# Patient Record
Sex: Female | Born: 1990 | Race: Black or African American | Hispanic: No | Marital: Married | Smoking: Never smoker
Health system: Southern US, Community
[De-identification: ages and names within clinical notes are randomized; demographics above are authoritative.]

## PROBLEM LIST (undated history)

## (undated) DIAGNOSIS — J3489 Other specified disorders of nose and nasal sinuses: Secondary | ICD-10-CM

## (undated) HISTORY — PX: OTHER SURGICAL HISTORY: SHX169

## (undated) HISTORY — DX: Other specified disorders of nose and nasal sinuses: J34.89

---
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
## 2012-02-10 ENCOUNTER — Ambulatory Visit (INDEPENDENT_AMBULATORY_CARE_PROVIDER_SITE_OTHER): Payer: Commercial Managed Care - PPO | Admitting: Obstetrics and Gynecology

## 2012-02-10 ENCOUNTER — Encounter: Payer: Self-pay | Admitting: Obstetrics and Gynecology

## 2012-02-10 VITALS — BP 102/60 | HR 64 | Ht 67.0 in | Wt 153.0 lb

## 2012-02-10 DIAGNOSIS — B373 Candidiasis of vulva and vagina: Secondary | ICD-10-CM

## 2012-02-10 DIAGNOSIS — Z113 Encounter for screening for infections with a predominantly sexual mode of transmission: Secondary | ICD-10-CM

## 2012-02-10 DIAGNOSIS — N898 Other specified noninflammatory disorders of vagina: Secondary | ICD-10-CM

## 2012-02-10 MED ORDER — TERCONAZOLE 0.8 % VA CREA: 1.0000 | TOPICAL_CREAM | Freq: Every day | VAGINAL | Status: AC

## 2012-02-10 NOTE — Progress Notes (Signed)
Odor: yes Fever: no Pelvic Pain: no  Itching: no Dyspareunia: no Desires GC/CT: yes  Thin: no History of PID: no Desires HIV,RPR,HbsAG: no  Thick: yes History of STD: no Other: check for bv and want to tested for gc/ct

## 2012-02-10 NOTE — Progress Notes (Signed)
20 YO complains of vaginal discharge with odor.  Also want GC/CT testing.   O: Pelvic: EGBUS-wnl, vagina-white discharge, cervix-no lesions, uterus-normal size, adnexae-no tenderness/masses   Wet Prep: pH 4.5, whiff-negative, yeast   A: Yeast Vaginitis  P: GC/CT pending      Terzol 3 Vaginal  #1 1 applicatorful pv daily x 3 days 0 rf

## 2012-02-11 LAB — GC/CHLAMYDIA PROBE AMP, GENITAL
Chlamydia, DNA Probe: NEGATIVE
GC Probe Amp, Genital: NEGATIVE

## 2012-02-13 ENCOUNTER — Telehealth: Payer: Self-pay | Admitting: Obstetrics and Gynecology

## 2012-02-13 NOTE — Telephone Encounter (Signed)
-----   Message from Jerilynn Mages sent at 02/13/2012 3:39 PM -----  PT HAS QUESTION FOR EP.

## 2012-02-13 NOTE — Telephone Encounter (Deleted)
-----   Message from Russell E Dye sent at 02/13/2012 3:39 PM -----  PT HAS QUESTION FOR EP.  

## 2012-02-13 NOTE — Telephone Encounter (Signed)
Triage/epic 

## 2012-03-04 ENCOUNTER — Telehealth: Payer: Self-pay | Admitting: Obstetrics and Gynecology

## 2012-03-04 ENCOUNTER — Encounter: Payer: Self-pay | Admitting: Obstetrics and Gynecology

## 2012-03-04 ENCOUNTER — Ambulatory Visit (INDEPENDENT_AMBULATORY_CARE_PROVIDER_SITE_OTHER): Payer: Commercial Managed Care - PPO | Admitting: Obstetrics and Gynecology

## 2012-03-04 VITALS — BP 110/70 | Temp 98.6°F | Ht 67.0 in | Wt 158.0 lb

## 2012-03-04 DIAGNOSIS — N898 Other specified noninflammatory disorders of vagina: Secondary | ICD-10-CM

## 2012-03-04 DIAGNOSIS — N760 Acute vaginitis: Secondary | ICD-10-CM

## 2012-03-04 DIAGNOSIS — Z130 Encounter for screening for diseases of the blood and blood-forming organs and certain disorders involving the immune mechanism: Secondary | ICD-10-CM

## 2012-03-04 DIAGNOSIS — Z113 Encounter for screening for infections with a predominantly sexual mode of transmission: Secondary | ICD-10-CM

## 2012-03-04 DIAGNOSIS — B9689 Other specified bacterial agents as the cause of diseases classified elsewhere: Secondary | ICD-10-CM

## 2012-03-04 DIAGNOSIS — N390 Urinary tract infection, site not specified: Secondary | ICD-10-CM

## 2012-03-04 DIAGNOSIS — A499 Bacterial infection, unspecified: Secondary | ICD-10-CM

## 2012-03-04 DIAGNOSIS — Z13228 Encounter for screening for other metabolic disorders: Secondary | ICD-10-CM

## 2012-03-04 DIAGNOSIS — Z1321 Encounter for screening for nutritional disorder: Secondary | ICD-10-CM

## 2012-03-04 DIAGNOSIS — R809 Proteinuria, unspecified: Secondary | ICD-10-CM

## 2012-03-04 LAB — POCT WET PREP (WET MOUNT)
Whiff Test: NEGATIVE
pH: 4.5

## 2012-03-04 LAB — POCT URINALYSIS DIPSTICK
Bilirubin, UA: NEGATIVE
Blood, UA: 1
Glucose, UA: NEGATIVE
Ketones, UA: NEGATIVE
Nitrite, UA: NEGATIVE
Spec Grav, UA: 1.02
Urobilinogen, UA: NEGATIVE
pH, UA: 5

## 2012-03-04 MED ORDER — AMBULATORY NON FORMULARY MEDICATION: 1.0000 | Status: DC

## 2012-03-04 MED ORDER — METRONIDAZOLE 0.75 % VA GEL: 1.0000 | Freq: Two times a day (BID) | VAGINAL | Status: AC

## 2012-03-04 NOTE — Patient Instructions (Signed)
Avoid: - excess soap on genital area (consider using plain oatmeal soap) - use of powder or sprays in genital area - douching - wearing underwear to bed (except with menses) - using more than is directed detergent when washing clothes - tight fitting garments around genital area - excess sugar intake   [place] pharmacies that carry Boric Acid Capsules/Suppositories:  [facility] [address] (only)  [phone]  [facility]  [address] ([facility])  [phone]  [facility] ([place])  [address] [phone]  [facility]  [address] [phone]      

## 2012-03-04 NOTE — Progress Notes (Signed)
Color: WHITE Odor: yes Itching:no Thin:yes Thick:no Fever:no Dyspareunia:no Hx PID:no HX STD:no Pelvic Pain:no Desires Gc/CT:no Desires HIV,RPR,HbsAG:yesHIV,RPR,HSV2

## 2012-03-04 NOTE — Progress Notes (Signed)
20 YO reports protein in urine during a physical exam at work and would like her urine rechecked. Also complains of a vaginal discharge with odor and wants STD testing. Patient denies urinary tract symptoms but states that urine has an odor.   O: U/A= pH-5.0; SG-1.020; blood 1+; protein-trace; leukocytes-1+  Wet Prep:  pH-5.0;  whiff  +;   clue +  Pelvic: EGBUS-wnl; vagina-thin grey discharge; cervix-no lesions, uterus-non-tender   A:  Bacterial Vaginosis  H/O proteinura    P:  urine for culture           STD testing   Boric Acid Capsules 600 mg # 30 1 pv twice weekly             x 4 weeks then prn 11 refills   Metrogel Vaginal #1 tube 1 applicatorful pv qd x 5             days no refills   Vitamin D 25-H-pending   RTO as scheduled or prn  Cheryl Pereda, PA-C

## 2012-03-04 NOTE — Telephone Encounter (Signed)
TC TO PT REGARDING MSG, LM ON VM TO CALL BACK. 

## 2012-03-04 NOTE — Telephone Encounter (Signed)
EP pt/had appt today. 

## 2012-03-05 ENCOUNTER — Telehealth: Payer: Self-pay

## 2012-03-05 LAB — URINE CULTURE
Colony Count: NO GROWTH
Organism ID, Bacteria: NO GROWTH

## 2012-03-05 LAB — HIV ANTIBODY (ROUTINE TESTING W REFLEX): HIV: NONREACTIVE

## 2012-03-05 LAB — RPR

## 2012-03-05 LAB — HSV 2 ANTIBODY, IGG: HSV 2 Glycoprotein G Ab, IgG: <0.1 IV

## 2012-03-05 NOTE — Telephone Encounter (Signed)
Tc to pt regarding test results. Informed pt that her RPR,HIV,HSV2,  were all nl;vit d was abnormal. Went over vit d protocol with pt and will call in Rx vit d 50,000 units 1 cap 2x weekly for 8 wk's. Disp 28 with 0 refills to pt pharmacy.Pt voiced understanding

## 2012-06-22 ENCOUNTER — Ambulatory Visit (INDEPENDENT_AMBULATORY_CARE_PROVIDER_SITE_OTHER): Payer: 59 | Admitting: Obstetrics and Gynecology

## 2012-06-22 ENCOUNTER — Encounter: Payer: Self-pay | Admitting: Obstetrics and Gynecology

## 2012-06-22 VITALS — BP 90/66 | Resp 16 | Ht 67.0 in | Wt 157.0 lb

## 2012-06-22 DIAGNOSIS — Z113 Encounter for screening for infections with a predominantly sexual mode of transmission: Secondary | ICD-10-CM

## 2012-06-22 DIAGNOSIS — E559 Vitamin D deficiency, unspecified: Secondary | ICD-10-CM

## 2012-06-22 LAB — RPR

## 2012-06-22 LAB — HEPATITIS C ANTIBODY: HCV Ab: NEGATIVE

## 2012-06-22 LAB — HIV ANTIBODY (ROUTINE TESTING W REFLEX): HIV: NONREACTIVE

## 2012-06-22 MED ORDER — CHOLECALCIFEROL 125 MCG (5000 UT) PO CAPS: 5000.0000 [IU] | ORAL_CAPSULE | Freq: Every day | ORAL | Status: DC

## 2012-06-22 MED ORDER — METRONIDAZOLE 500 MG PO TABS: 500.0000 mg | ORAL_TABLET | Freq: Two times a day (BID) | ORAL | Status: AC

## 2012-06-22 MED ORDER — FLORAJEN3 PO CAPS: 1.0000 | ORAL_CAPSULE | Freq: Every day | ORAL | Status: DC

## 2012-06-22 NOTE — Patient Instructions (Signed)
Bacterial Vaginosis Bacterial vaginosis (BV) is a vaginal infection where the normal balance of bacteria in the vagina is disrupted. The normal balance is then replaced by an overgrowth of certain bacteria. There are several different kinds of bacteria that can cause BV. BV is the most common vaginal infection in women of childbearing age. CAUSES   The cause of BV is not fully understood. BV develops when there is an increase or imbalance of harmful bacteria.   Some activities or behaviors can upset the normal balance of bacteria in the vagina and put women at increased risk including:   Having a new sex partner or multiple sex partners.   Douching.   Using an intrauterine device (IUD) for contraception.   It is not clear what role sexual activity plays in the development of BV. However, women that have never had sexual intercourse are rarely infected with BV.  Women do not get BV from toilet seats, bedding, swimming pools or from touching objects around them.  SYMPTOMS   Grey vaginal discharge.   A fish-like odor with discharge, especially after sexual intercourse.   Itching or burning of the vagina and vulva.   Burning or pain with urination.   Some women have no signs or symptoms at all.  DIAGNOSIS  Your caregiver must examine the vagina for signs of BV. Your caregiver will perform lab tests and look at the sample of vaginal fluid through a microscope. They will look for bacteria and abnormal cells (clue cells), a pH test higher than 4.5, and a positive amine test all associated with BV.  RISKS AND COMPLICATIONS   Pelvic inflammatory disease (PID).   Infections following gynecology surgery.   Developing HIV.   Developing herpes virus.  TREATMENT  Sometimes BV will clear up without treatment. However, all women with symptoms of BV should be treated to avoid complications, especially if gynecology surgery is planned. Female partners generally do not need to be treated. However,  BV may spread between female sex partners so treatment is helpful in preventing a recurrence of BV.   BV may be treated with antibiotics. The antibiotics come in either pill or vaginal cream forms. Either can be used with nonpregnant or pregnant women, but the recommended dosages differ. These antibiotics are not harmful to the baby.   BV can recur after treatment. If this happens, a second round of antibiotics will often be prescribed.   Treatment is important for pregnant women. If not treated, BV can cause a premature delivery, especially for a pregnant woman who had a premature birth in the past. All pregnant women who have symptoms of BV should be checked and treated.   For chronic reoccurrence of BV, treatment with a type of prescribed gel vaginally twice a week is helpful.  HOME CARE INSTRUCTIONS   Finish all medication as directed by your caregiver.   Do not have sex until treatment is completed.   Tell your sexual partner that you have a vaginal infection. They should see their caregiver and be treated if they have problems, such as a mild rash or itching.   Practice safe sex. Use condoms. Only have 1 sex partner.  PREVENTION  Basic prevention steps can help reduce the risk of upsetting the natural balance of bacteria in the vagina and developing BV:  Do not have sexual intercourse (be abstinent).   Do not douche.   Use all of the medicine prescribed for treatment of BV, even if the signs and symptoms go away.     Tell your sex partner if you have BV. That way, they can be treated, if needed, to prevent reoccurrence.  SEEK MEDICAL CARE IF:   Your symptoms are not improving after 3 days of treatment.   You have increased discharge, pain, or fever.  MAKE SURE YOU:   Understand these instructions.   Will watch your condition.   Will get help right away if you are not doing well or get worse.  FOR MORE INFORMATION  Division of STD Prevention (DSTDP), Centers for Disease  Control and Prevention: www.cdc.gov/std American Social Health Association (ASHA): www.ashastd.org  Document Released: 09/15/2005 Document Revised: 09/04/2011 Document Reviewed: 03/08/2009 ExitCare Patient Information 2012 [employer]. 

## 2012-06-22 NOTE — Progress Notes (Signed)
Regular Periods: yes Mammogram: no  Monthly Breast Ex.: yes Exercise: yes  Tetanus < 10 years: yes Seatbelts: yes  NI. Bladder Functn.: yes Abuse at home: no  Daily BM's: no but regular for her per pt Stressful Work: no  Healthy Diet: yes Sigmoid-Colonoscopy: never  Calcium: no Medical problems this year: Pt wants STD testing, needs Vit D rechecked, & c/o recurrent BV   LAST PAP: 09/2011 WNL  Contraception: Pt doesn't desire BC at this time .   Mammogram:  Never   PCP: Henreitta Leber  PMH: no changes  FMH: no changes   Last Bone Scan: Never

## 2012-06-23 LAB — GC/CHLAMYDIA PROBE AMP, GENITAL: GC Probe Amp, Genital: NEGATIVE

## 2012-06-24 ENCOUNTER — Telehealth: Payer: Self-pay | Admitting: Obstetrics and Gynecology

## 2012-06-24 NOTE — Telephone Encounter (Signed)
Spoke with pt rgd msg informed all lab wnl pt voice understanding

## 2012-06-24 NOTE — Telephone Encounter (Signed)
Lm on vm tcb rgd msg 

## 2012-08-11 NOTE — Progress Notes (Signed)
S: requests STD testing and f/u vit D O: EGBUS WNL Vagina and cervix appear normal  sm amt clear discharge, neg whiff Bimanual exam WNL, neg CMT, neg tenderness, no masses A: normal pelvic Trich and BV neg,  P: STD panel and vit D level Counseled re: safe sex, good hygiene rx probiotic  F/u PRN or January for annual   S.Elinora Weigand, CNM

## 2012-12-07 ENCOUNTER — Telehealth: Payer: Self-pay | Admitting: Obstetrics and Gynecology

## 2012-12-07 NOTE — Telephone Encounter (Signed)
Spoke with pt requesting test results from 05/2012. Informed pt RPR, HIV, Hep B, Hep C, Osom Trich, Osom BV, and Vit D all WNL. Pt agrees and voices understanding.

## 2012-12-10 ENCOUNTER — Telehealth: Payer: Self-pay | Admitting: Obstetrics and Gynecology

## 2012-12-10 NOTE — Telephone Encounter (Signed)
Pt advised no other call from office except by TG. Pt voiced understanding  Darien Ramus, CMA

## 2012-12-10 NOTE — Telephone Encounter (Signed)
LVM for pt to return call      Spoke with pt requesting test results from 05/2012. Informed pt RPR, HIV, Hep B, Hep C, Osom Trich, Osom BV, and Vit D all WNL. Pt agrees and voices understanding.   Note copied from TG on 12/07/12

## 2015-09-27 LAB — HM PAP SMEAR: HM PAP: NORMAL

## 2015-12-04 LAB — OB RESULTS CONSOLE ABO/RH: RH TYPE: POSITIVE

## 2015-12-04 LAB — OB RESULTS CONSOLE RUBELLA ANTIBODY, IGM: RUBELLA: IMMUNE

## 2015-12-04 LAB — OB RESULTS CONSOLE GC/CHLAMYDIA
Chlamydia: NEGATIVE
Gonorrhea: NEGATIVE

## 2015-12-04 LAB — OB RESULTS CONSOLE RPR: RPR: NONREACTIVE

## 2015-12-04 LAB — OB RESULTS CONSOLE ANTIBODY SCREEN: Antibody Screen: NEGATIVE

## 2015-12-04 LAB — OB RESULTS CONSOLE HEPATITIS B SURFACE ANTIGEN: Hepatitis B Surface Ag: NEGATIVE

## 2015-12-04 LAB — OB RESULTS CONSOLE HIV ANTIBODY (ROUTINE TESTING): HIV: NONREACTIVE

## 2016-01-01 ENCOUNTER — Encounter: Payer: Self-pay | Admitting: Family Medicine

## 2016-01-01 ENCOUNTER — Ambulatory Visit (INDEPENDENT_AMBULATORY_CARE_PROVIDER_SITE_OTHER): Payer: BLUE CROSS/BLUE SHIELD | Admitting: Family Medicine

## 2016-01-01 VITALS — BP 132/64 | HR 102 | Ht 67.0 in | Wt 187.0 lb

## 2016-01-01 DIAGNOSIS — Z0001 Encounter for general adult medical examination with abnormal findings: Secondary | ICD-10-CM

## 2016-01-01 DIAGNOSIS — J3089 Other allergic rhinitis: Secondary | ICD-10-CM

## 2016-01-01 DIAGNOSIS — J309 Allergic rhinitis, unspecified: Secondary | ICD-10-CM | POA: Insufficient documentation

## 2016-01-01 NOTE — Progress Notes (Addendum)
Tana ConchStephen Raygen Dahm, MD Phone: [phone]  Subjective:  Patient presents today to establish care. [facility]. Chief complaint-noted.   See problem oriented charting  The following were reviewed and entered/updated in epic: Past Medical History  Diagnosis Date  . Rhinorrhea     stuffy, runny year round   Patient Active Problem List   Diagnosis Date Noted  . Allergic rhinitis 01/01/2016   Past Surgical History  Procedure Laterality Date  . None      Family History  Problem Relation Age of Onset  . Heart disease Paternal Grandfather     unknown specific ailment  . Hypertension Paternal Grandmother   . Diabetes Maternal Grandmother   . Hypertension Mother   . Sarcoidosis Father     Medications- reviewed and updated Current Outpatient Prescriptions  Medication Sig Dispense Refill  . Prenatal Vit-Fe Fumarate-FA (PRENATAL VITAMIN PO) Take by mouth.     No current facility-administered medications for this visit.    Allergies-reviewed and updated No Known Allergies  Social History   Social History  . Marital Status: Married    Spouse Name: N/A  . Number of Children: N/A  . Years of Education: N/A   Social History Main Topics  . Smoking status: Never Smoker   . Smokeless tobacco: Not on file  . Alcohol Use: 0.0 - 0.6 oz/week    0-1 Standard drinks or equivalent per week     Comment: few times a month. as of 01/01/16 not drinking at all.   . Drug Use: No  . Sexual Activity: Yes   Other Topics Concern  . Not on file   Social History Narrative   Family: Married. Pregnant with first child as of 10/11/14.    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       Hobbies: four wheelers, enjoys travel    ROS--Full ROS was completed Review of Systems  Constitutional: Negative  for fever and chills.  HENT: Negative for hearing loss.   Eyes: Negative for blurred vision and double vision.  Respiratory: Negative for cough and shortness of breath.   Cardiovascular: Negative for chest pain and palpitations.  Gastrointestinal: Negative for heartburn and nausea.  Genitourinary: Negative for dysuria and urgency.  Musculoskeletal: Negative for myalgias and neck pain.  Skin: Negative for itching and rash.  Neurological: Negative for dizziness, tingling and headaches.  Endo/Heme/Allergies: Negative for polydipsia. Does not bruise/bleed easily.  Psychiatric/Behavioral: Negative for suicidal ideas and hallucinations.   Objective: BP 132/64 mmHg  Pulse 102  Ht 5\' 7"  (1.702 m)  Wt 187 lb (84.823 kg)  BMI 29.28 kg/m2 Gen: NAD, resting comfortably HEENT: Mucous membranes are moist. Oropharynx normal. TM normal. Eyes: sclera and lids normal, PERRLA Neck: no thyromegaly, no cervical lymphadenopathy CV: RRR no murmurs rubs or gallops Lungs: CTAB no crackles, wheeze, rhonchi Abdomen: soft/nontender/nondistended/normal bowel sounds. No rebound or guarding.  Ext: no edema Skin: warm, dry Neuro: 5/5 strength in upper and lower extremities, normal gait, normal reflexes  Assessment/Plan:  25 y.o. female presenting for annual physical.  Health Maintenance counseling: 1. Anticipatory guidance: Patient counseled regarding regular dental exams, eye exams, wearing seatbelts.  2. Risk factor reduction:  Advised patient of need for regular exercise and diet rich and fruits and vegetables to reduce risk of heart attack and stroke.  Walks 3x a week. Goal weight postpartum [id number] in long run.  3. Immunizations/screenings/ancillary studies Health Maintenance Due  Topic Date Due  . HIV Screening - done in pregnancy 09/11/2005  . TETANUS/TDAP - get between 27-36 weeks of pregnancy 09/11/2009  . PAP SMEAR- Sign release of information at the check out desk for records  09/12/2011   4.  Cervical cancer screening- getting records.  pap 09/27/15.  5. Breast cancer screening-  Start at age 33 (consider getting baseline at 69) 6. Colon cancer screening - start at age 50 with no family history  Allergic rhinitis S: Patient with long term issues with nasal congestion and runny nose. Is on claritin at present- ok per GYN.  Some improvement on this but still with rather severe congestion. Neti pot comes back out same nare at times.  A/P:Flonase is reasonable as topical- given poor control we will add this in OTC   Patient [redacted] weeks pregnant- will follow up prn with me after pregnancy. Healthy and doing well.  Return precautions advised for rhinitis   Meds ordered this encounter  Medications  . Prenatal Vit-Fe Fumarate-FA (PRENATAL VITAMIN PO)    Sig: Take by mouth.

## 2016-01-01 NOTE — Assessment & Plan Note (Signed)
S: Patient with long term issues with nasal congestion and runny nose. Is on claritin at present- ok per GYN.  Some improvement on this but still with rather severe congestion. Neti pot comes back out same nare at times.  A/P:Flonase is reasonable as topical- given poor control we will add this in OTC

## 2016-01-01 NOTE — Patient Instructions (Addendum)
Health Maintenance Due  Topic Date Due  . HIV Screening - done in pregnancy 09/11/2005  . TETANUS/TDAP - get between 27-36 weeks of pregnancy 09/11/2009  . PAP SMEAR- Sign release of information at the check out desk for records  09/12/2011   For the runny nose Claritin is fine in pregnancy You can also use flonase in addition to claritin if having issues  More than happy to see your husband. They may work him in for any 30 minute slot during the week (monday- Friday)

## 2016-01-04 ENCOUNTER — Encounter: Payer: Self-pay | Admitting: Family Medicine

## 2016-01-17 ENCOUNTER — Inpatient Hospital Stay (HOSPITAL_COMMUNITY): Admission: AD | Admit: 2016-01-17 | Payer: Self-pay | Source: Ambulatory Visit | Admitting: Obstetrics & Gynecology

## 2016-02-18 ENCOUNTER — Telehealth: Payer: Self-pay | Admitting: Family Medicine

## 2016-02-18 NOTE — Telephone Encounter (Signed)
Pt said  her ins will cover annual physical. md must use routine code. Pt said thank you

## 2016-02-18 NOTE — Addendum Note (Signed)
Addended by: Shelva MajesticHUNTER, Skyler Dusing O on: 02/18/2016 06:12 PM   Modules accepted: Level of Service

## 2016-02-18 NOTE — Telephone Encounter (Signed)
Please let her know I asked that we resubmit this as physical since we did complete CPE at time of visit. I updated documentation and and having it checked with coding if resubmission is possible.

## 2016-02-19 NOTE — Telephone Encounter (Signed)
Pt is returning misty call °

## 2016-02-19 NOTE — Telephone Encounter (Signed)
Pt advised of message. She wanted me to tell you thanks so much. FYI

## 2016-02-19 NOTE — Telephone Encounter (Signed)
Left message for pt to call back  °

## 2016-05-01 ENCOUNTER — Encounter: Payer: Self-pay | Admitting: Obstetrics and Gynecology

## 2016-06-19 LAB — OB RESULTS CONSOLE GBS: GBS: POSITIVE

## 2016-07-04 ENCOUNTER — Inpatient Hospital Stay (HOSPITAL_COMMUNITY)
Admission: AD | Admit: 2016-07-04 | Discharge: 2016-07-04 | Disposition: A | Discharge status: Home / Self Care | Payer: BLUE CROSS/BLUE SHIELD | Source: Ambulatory Visit | Attending: Obstetrics and Gynecology | Admitting: Obstetrics and Gynecology

## 2016-07-04 ENCOUNTER — Encounter (HOSPITAL_COMMUNITY): Payer: Self-pay

## 2016-07-04 DIAGNOSIS — Z3A38 38 weeks gestation of pregnancy: Secondary | ICD-10-CM | POA: Insufficient documentation

## 2016-07-04 DIAGNOSIS — O219 Vomiting of pregnancy, unspecified: Secondary | ICD-10-CM

## 2016-07-04 DIAGNOSIS — Z3689 Encounter for other specified antenatal screening: Secondary | ICD-10-CM

## 2016-07-04 DIAGNOSIS — O368130 Decreased fetal movements, third trimester, not applicable or unspecified: Secondary | ICD-10-CM | POA: Insufficient documentation

## 2016-07-04 DIAGNOSIS — O212 Late vomiting of pregnancy: Secondary | ICD-10-CM | POA: Diagnosis not present

## 2016-07-04 DIAGNOSIS — Z3493 Encounter for supervision of normal pregnancy, unspecified, third trimester: Secondary | ICD-10-CM

## 2016-07-04 LAB — URINE MICROSCOPIC-ADD ON: RBC / HPF: NONE SEEN RBC/hpf (ref 0–5)

## 2016-07-04 LAB — URINALYSIS, ROUTINE W REFLEX MICROSCOPIC
Bilirubin Urine: NEGATIVE
Glucose, UA: 100 mg/dL — AB
Hgb urine dipstick: NEGATIVE
Ketones, ur: NEGATIVE mg/dL
Nitrite: NEGATIVE
PROTEIN: NEGATIVE mg/dL
SPECIFIC GRAVITY, URINE: 1.015 (ref 1.005–1.030)
pH: 7 (ref 5.0–8.0)

## 2016-07-04 NOTE — MAU Provider Note (Signed)
History     CSN: [id number]  Arrival date and time: 07/04/16 1434   None     Chief Complaint  Patient presents with  . Decreased Fetal Movement  . Nausea   G2P0010 @38 .0 weeks here with decreased FM. She has felt some FM today but was decreased. She also reports vomiting immediately after she ate breakfast (sausage cheese biscuit).  She denies VB, LOF, and ctx. She ate a sandwich and fries for lunch and has tolerated that.    OB History    Gravida Para Term Preterm AB Living   2       1     SAB TAB Ectopic Multiple Live Births     1            Past Medical History:  Diagnosis Date  . Asthma   . Rhinorrhea    stuffy, runny year round    Past Surgical History:  Procedure Laterality Date  . none      Family History  Problem Relation Age of Onset  . Heart disease Paternal Grandfather     unknown specific ailment  . Hypertension Paternal Grandmother   . Diabetes Maternal Grandmother   . Hypertension Mother   . Sarcoidosis Father   . Hypertension Maternal Aunt     Social History  Substance Use Topics  . Smoking status: Never Smoker  . Smokeless tobacco: Never Used  . Alcohol use 0.0 - 0.6 oz/week     Comment: few times a month. as of 01/01/16 not drinking at all.     Allergies: No Known Allergies  Prescriptions Prior to Admission  Medication Sig Dispense Refill Last Dose  . ferrous sulfate 325 (65 FE) MG tablet Take 325 mg by mouth at bedtime.   07/03/2016 at Unknown time  . Prenatal Vit-Fe Fumarate-FA (PRENATAL MULTIVITAMIN) TABS tablet Take 1 tablet by mouth daily with lunch.   07/04/2016 at Unknown time    Review of Systems  Constitutional: Negative for chills and fever.  Gastrointestinal: Negative.  Negative for diarrhea.   Physical Exam   Blood pressure 119/66, pulse 112, temperature 98.6 F (37 C), temperature source Oral, resp. rate 16, height 5\' 7"  (1.702 m), weight 101.6 kg (224 lb).  Physical Exam  Constitutional: She is oriented to person,  place, and time. She appears well-developed and well-nourished.  HENT:  Head: Normocephalic and atraumatic.  Neck: Normal range of motion. Neck supple.  Cardiovascular: Normal rate.   Respiratory: Effort normal.  GI: Soft. She exhibits no distension. There is no tenderness.  gravid  Musculoskeletal: Normal range of motion.  Neurological: She is alert and oriented to person, place, and time.  Skin: Skin is warm and dry.  Psychiatric: She has a normal mood and affect.  EFM: 145 bpm, mod variability, + accels, no decels Toco: irregular, mild  Results for orders placed or performed during the hospital encounter of 07/04/16 (from the past 24 hour(s))  Urinalysis, Routine w reflex microscopic (not [facility])     Status: Abnormal   Collection Time: 07/04/16  2:55 PM  Result Value Ref Range   Color, Urine YELLOW YELLOW   APPearance CLEAR CLEAR   Specific Gravity, Urine 1.015 1.005 - 1.030   pH 7.0 5.0 - 8.0   Glucose, UA 100 (A) NEGATIVE mg/dL   Hgb urine dipstick NEGATIVE NEGATIVE   Bilirubin Urine NEGATIVE NEGATIVE   Ketones, ur NEGATIVE NEGATIVE mg/dL   Protein, ur NEGATIVE NEGATIVE mg/dL   Nitrite NEGATIVE NEGATIVE  Leukocytes, UA SMALL (A) NEGATIVE  Urine microscopic-add on     Status: Abnormal   Collection Time: 07/04/16  2:55 PM  Result Value Ref Range   Squamous Epithelial / LPF 0-5 (A) NONE SEEN   WBC, UA 0-5 0 - 5 WBC/hpf   RBC / HPF NONE SEEN 0 - 5 RBC/hpf   Bacteria, UA RARE (A) NONE SEEN    MAU Course  Procedures  MDM Pt reports +FM since arrival, audible FM, and reactive NST. No nausea or emesis. Discussed presentation, clinical findings, and plan with Dr. Su Hilt. Stable for discharge home.  Assessment and Plan   1. Third trimester pregnancy   2. Decreased fetal movements in third trimester, single or unspecified fetus   3. NST (non-stress test) reactive   4. Vomiting during pregnancy    Discharge home [facility] diet today Follow up in office next week as  scheduled    Medication List    TAKE these medications   ferrous sulfate 325 (65 FE) MG tablet Take 325 mg by mouth at bedtime.   prenatal multivitamin Tabs tablet Take 1 tablet by mouth daily with lunch.      Donette Larry, CNM 07/04/2016, 3:37 PM

## 2016-07-04 NOTE — MAU Note (Signed)
Patient presents with decreased fetal movement last time around 1:00, having nausea vomited 1 time.

## 2016-07-04 NOTE — Discharge Instructions (Signed)
Fetal Movement Counts  Patient Name: __________________________________________________ Patient Due Date: ____________________  Performing a fetal movement count is highly recommended in high-risk pregnancies, but it is good for every pregnant woman to do. Your health care provider may ask you to start counting fetal movements at 28 weeks of the pregnancy. Fetal movements often increase:  · After eating a full meal.  · After physical activity.  · After eating or drinking something sweet or cold.  · At rest.  Pay attention to when you feel the baby is most active. This will help you notice a pattern of your baby's sleep and wake cycles and what factors contribute to an increase in fetal movement. It is important to perform a fetal movement count at the same time each day when your baby is normally most active.   HOW TO COUNT FETAL MOVEMENTS  1. Find a quiet and comfortable area to sit or lie down on your left side. Lying on your left side provides the best blood and oxygen circulation to your baby.  2. Write down the day and time on a sheet of paper or in a journal.  3. Start counting kicks, flutters, swishes, rolls, or jabs in a 2-hour period. You should feel at least 10 movements within 2 hours.  4. If you do not feel 10 movements in 2 hours, wait 2-3 hours and count again. Look for a change in the pattern or not enough counts in 2 hours.  SEEK MEDICAL CARE IF:  · You feel less than 10 counts in 2 hours, tried twice.  · There is no movement in over an hour.  · The pattern is changing or taking longer each day to reach 10 counts in 2 hours.  · You feel the baby is not moving as he or she usually does.  Date: ____________ Movements: ____________ Start time: ____________ Finish time: ____________   Date: ____________ Movements: ____________ Start time: ____________ Finish time: ____________  Date: ____________ Movements: ____________ Start time: ____________ Finish time: ____________  Date: ____________ Movements:  ____________ Start time: ____________ Finish time: ____________  Date: ____________ Movements: ____________ Start time: ____________ Finish time: ____________  Date: ____________ Movements: ____________ Start time: ____________ Finish time: ____________  Date: ____________ Movements: ____________ Start time: ____________ Finish time: ____________  Date: ____________ Movements: ____________ Start time: ____________ Finish time: ____________   Date: ____________ Movements: ____________ Start time: ____________ Finish time: ____________  Date: ____________ Movements: ____________ Start time: ____________ Finish time: ____________  Date: ____________ Movements: ____________ Start time: ____________ Finish time: ____________  Date: ____________ Movements: ____________ Start time: ____________ Finish time: ____________  Date: ____________ Movements: ____________ Start time: ____________ Finish time: ____________  Date: ____________ Movements: ____________ Start time: ____________ Finish time: ____________  Date: ____________ Movements: ____________ Start time: ____________ Finish time: ____________   Date: ____________ Movements: ____________ Start time: ____________ Finish time: ____________  Date: ____________ Movements: ____________ Start time: ____________ Finish time: ____________  Date: ____________ Movements: ____________ Start time: ____________ Finish time: ____________  Date: ____________ Movements: ____________ Start time: ____________ Finish time: ____________  Date: ____________ Movements: ____________ Start time: ____________ Finish time: ____________  Date: ____________ Movements: ____________ Start time: ____________ Finish time: ____________  Date: ____________ Movements: ____________ Start time: ____________ Finish time: ____________   Date: ____________ Movements: ____________ Start time: ____________ Finish time: ____________  Date: ____________ Movements: ____________ Start time: ____________ Finish  time: ____________  Date: ____________ Movements: ____________ Start time: ____________ Finish time: ____________  Date: ____________ Movements: ____________ Start time:   ____________ Finish time: ____________  Date: ____________ Movements: ____________ Start time: ____________ Finish time: ____________  Date: ____________ Movements: ____________ Start time: ____________ Finish time: ____________  Date: ____________ Movements: ____________ Start time: ____________ Finish time: ____________   Date: ____________ Movements: ____________ Start time: ____________ Finish time: ____________  Date: ____________ Movements: ____________ Start time: ____________ Finish time: ____________  Date: ____________ Movements: ____________ Start time: ____________ Finish time: ____________  Date: ____________ Movements: ____________ Start time: ____________ Finish time: ____________  Date: ____________ Movements: ____________ Start time: ____________ Finish time: ____________  Date: ____________ Movements: ____________ Start time: ____________ Finish time: ____________  Date: ____________ Movements: ____________ Start time: ____________ Finish time: ____________   Date: ____________ Movements: ____________ Start time: ____________ Finish time: ____________  Date: ____________ Movements: ____________ Start time: ____________ Finish time: ____________  Date: ____________ Movements: ____________ Start time: ____________ Finish time: ____________  Date: ____________ Movements: ____________ Start time: ____________ Finish time: ____________  Date: ____________ Movements: ____________ Start time: ____________ Finish time: ____________  Date: ____________ Movements: ____________ Start time: ____________ Finish time: ____________  Date: ____________ Movements: ____________ Start time: ____________ Finish time: ____________   Date: ____________ Movements: ____________ Start time: ____________ Finish time: ____________  Date: ____________  Movements: ____________ Start time: ____________ Finish time: ____________  Date: ____________ Movements: ____________ Start time: ____________ Finish time: ____________  Date: ____________ Movements: ____________ Start time: ____________ Finish time: ____________  Date: ____________ Movements: ____________ Start time: ____________ Finish time: ____________  Date: ____________ Movements: ____________ Start time: ____________ Finish time: ____________  Date: ____________ Movements: ____________ Start time: ____________ Finish time: ____________   Date: ____________ Movements: ____________ Start time: ____________ Finish time: ____________  Date: ____________ Movements: ____________ Start time: ____________ Finish time: ____________  Date: ____________ Movements: ____________ Start time: ____________ Finish time: ____________  Date: ____________ Movements: ____________ Start time: ____________ Finish time: ____________  Date: ____________ Movements: ____________ Start time: ____________ Finish time: ____________  Date: ____________ Movements: ____________ Start time: ____________ Finish time: ____________     This information is not intended to replace advice given to you by your health care provider. Make sure you discuss any questions you have with your health care provider.     Document Released: 10/15/2006 Document Revised: 10/06/2014 Document Reviewed: 07/12/2012  [employer]vier Interactive Patient Education ©2016 [employer]

## 2016-07-08 ENCOUNTER — Other Ambulatory Visit: Payer: Self-pay | Admitting: Obstetrics & Gynecology

## 2016-07-09 ENCOUNTER — Observation Stay (HOSPITAL_COMMUNITY)
Admission: RE | Admit: 2016-07-09 | Discharge: 2016-07-09 | Disposition: A | Discharge status: Home / Self Care | Payer: BLUE CROSS/BLUE SHIELD | Source: Ambulatory Visit | Attending: Obstetrics & Gynecology | Admitting: Obstetrics & Gynecology

## 2016-07-09 ENCOUNTER — Other Ambulatory Visit: Payer: Self-pay | Admitting: Obstetrics and Gynecology

## 2016-07-09 DIAGNOSIS — Z3A38 38 weeks gestation of pregnancy: Secondary | ICD-10-CM | POA: Insufficient documentation

## 2016-07-09 DIAGNOSIS — O321XX0 Maternal care for breech presentation, not applicable or unspecified: Principal | ICD-10-CM | POA: Insufficient documentation

## 2016-07-09 LAB — CBC
HEMATOCRIT: 32.5 % — AB (ref 36.0–46.0)
Hemoglobin: 11.2 g/dL — ABNORMAL LOW (ref 12.0–15.0)
MCH: 31 pg (ref 26.0–34.0)
MCHC: 34.5 g/dL (ref 30.0–36.0)
MCV: 90 fL (ref 78.0–100.0)
Platelets: 178 10*3/uL (ref 150–400)
RBC: 3.61 MIL/uL — ABNORMAL LOW (ref 3.87–5.11)
RDW: 14 % (ref 11.5–15.5)
WBC: 9.5 10*3/uL (ref 4.0–10.5)

## 2016-07-09 LAB — ABO/RH: ABO/RH(D): O POS

## 2016-07-09 MED ORDER — TERBUTALINE SULFATE 1 MG/ML IJ SOLN
0.2500 mg | Freq: Once | INTRAMUSCULAR | Status: AC
  Administered 2016-07-09: 0.25 mg via SUBCUTANEOUS
  Filled 2016-07-09: qty 1

## 2016-07-09 NOTE — Procedures (Signed)
Cheryl Curtis, Cheryl Curtis Female, 24 y.o., 05-Jan-1991 MRN: [id number]  Pre-op diagnosis: 1.  Breech presentation. 2. 38 week 5 day EGA IUP  Post-op diagnosis: Same as above  Procedure: External cephalic version, unsuccessful  Surgeon:  Dr. Hoover BrownsEma Tatsuo Musial  Assistant:  Bernerd PhoNancy Prothero, CNM  Anesthesia:  None  Complications None  Findings:  Complete breech presenting fetus.   Indications:  25 y/o G2P0010 @ 6038 W 5 D EGA here for external cephalic version for breech presenting fetus    Procedure:   Informed consent was obtained from the patient to undergo the procedure. She was monitored on NST and found to have a category 1 tracing. An ultrasound done showed persistent complete breech presentation.  She received terbutaline subcutaneously.  External cephalic version was then attempted trying to move the baby in a forward turn unsuccessfully.  Attempt was made with backward turning, also unsuccessfully.  Fetal heart beat checks were performed every two minutes in between the attempts and remained in the normal range at each check.  After about 25 minutes of trial, patient expressed desire to terminate the procedure and wanted to be scheduled for a cesarean section.  Procedure was stopped and she was monitored by NST, found to have a category 1 tracing that was reactive.  She was discharged home in stable condition with plan of returning for a cesarean delivery at or after [redacted] weeks EGA.  We discussed risks, benefits and alternatives of cesarean delivery including risks of bleeding, infection, damage to organs, placenta complications such as placenta previa and accreta.  All her questions were answered.  Labor precautions as well as abruption and fetal kick counts were discussed.  Meanwhile she was going to try moxibustion to turn the baby.  She understood that moxibustion was not guaranteed to work and that a repeat ultrasound would be done pre-operatively before the cesarean section.   Dr. Sallye OberKulwa.

## 2016-07-09 NOTE — H&P (Signed)
Cheryl Curtis is a 25 y.o. female presenting for external cephalic version for breech presenting fetus. G2P0010 at 38 weeks 5 days EGA by LMP 10/12/15 and EDC 07/18/2016 (by LMP c/w US).     OB History    Gravida Para Term Preterm AB Living   2       1     SAB TAB Ectopic Multiple Live Births     1           Past Medical History:  Diagnosis Date  . Asthma   . Rhinorrhea    stuffy, runny year round   Past Surgical History:  Procedure Laterality Date  . none     Family History: family history includes Diabetes in her maternal grandmother; Heart disease in her paternal grandfather; Hypertension in her maternal aunt, mother, and paternal grandmother; Sarcoidosis in her father. Social History:  reports that she has never smoked. She has never used smokeless tobacco. She reports that she drinks alcohol. She reports that she does not use drugs.     Maternal Diabetes: No Genetic Screening: Normal Maternal Ultrasounds/Referrals: Abnormal:  Findings:   Other: Ultrasound 07/08/16: Complete breech presentation.  Posterior/fundal placenta.  AFI 14.  EFW 7lbs 14 oz.  Fetal Ultrasounds or other Referrals:  None Maternal Substance Abuse:  No Significant Maternal Medications:  None Significant Maternal Lab Results:  None Other Comments:  None  ROS  Constitutional: Denies fevers/chills Cardiovascular: Denies chest pain or palpitations Pulmonary: Denies coughing or wheezing Gastrointestinal: Denies nausea, vomiting or diarrhea Genitourinary: Denies pelvic pain, unusual vaginal bleeding, unusual vaginal discharge, dysuria, urgency or frequency.  Musculoskeletal: Denies muscle or joint aches and pain.  Neurology: Denies abnormal sensations such as tingling or numbness.   History   There were no vitals taken for this visit. Exam Physical Exam  Gen: NAD Abdomen:Soft, non tender, gravid.  150 BL, mod variability, reactive. TOCO: Irregular contractions Q 10 minutes (patient not feeling  them)  CBC    Component Value Date/Time   WBC 9.5 07/09/2016 0745   RBC 3.61 (L) 07/09/2016 0745   HGB 11.2 (L) 07/09/2016 0745   HCT 32.5 (L) 07/09/2016 0745   PLT 178 07/09/2016 0745   MCV 90.0 07/09/2016 0745   MCH 31.0 07/09/2016 0745   MCHC 34.5 07/09/2016 0745   RDW 14.0 07/09/2016 0745    Prenatal labs: ABO, Rh: --/--/O POS, O POS (10/11 0745) Antibody: NEG (10/11 0745) Rubella:  Immune RPR:  Non reactive  HBsAg:   Negative HIV:   Negative GBS:   Positive  Assessment/Plan:  [age] y/o P0 with breech presenting fetus here for external cephalic version  -Admit to Labor and delivery -NPO -IVfluids -Terbutaline Im X 1 -I discussed with patient and her partner risks, benefits and alternatives of external cephalic version including but not limited to risks of placenta abruption, rupture of membranes, abnormal fetal health tracing, fetal demise and cesarean delivery.  All of her questions were answered and she agreed to undergo the version.   Dr. Sallye OberKulwa.    [facility] CenterKULWA,Jarae Panas WAKURU 07/09/2016, 2:07 PM

## 2016-07-09 NOTE — Discharge Instructions (Signed)
Cesarean Delivery °Cesarean delivery is the birth of a baby through a cut (incision) in the abdomen and womb (uterus).  °LET YOUR HEALTH CARE PROVIDER KNOW ABOUT: °· All medicines you are taking, including vitamins, herbs, eye drops, creams, and over-the-counter medicines. °· Previous problems you or members of your family have had with the use of anesthetics. °· Any bleeding or blood clotting disorders you have. °· Family history of blood clots or bleeding disorders. °· Any history of deep vein thrombosis (DVT) or pulmonary embolism (PE). °· Previous surgeries you have had. °· Medical conditions you have. °· Any allergies you have. °· Complications involving the pregnancy. °RISKS AND COMPLICATIONS  °Generally, this is a safe procedure. However, as with any procedure, complications can occur. Possible complications include: °· Bleeding. °· Infection. °· Blood clots. °· Injury to surrounding organs. °· Problems with anesthesia. °· Injury to the baby. °BEFORE THE PROCEDURE  °· You may be given an antacid medicine to drink. This will prevent acid contents in your stomach from going into your lungs if you vomit during the surgery. °· You may be given an antibiotic medicine to prevent infection. °PROCEDURE  °· To prevent infection of your incision: °¨ Hair may be removed from your pubic area if it is near your incision. °¨ The skin of your pubic area and lower abdomen will be cleaned with a germ-killing solution (antiseptic). °· A tube (Foley catheter) will be placed in your bladder to drain your urine from your bladder into a bag. This keeps your bladder empty during surgery. °· An IV tube will be placed in your vein. °· You may be given medicine to numb the lower half of your body (regional anesthetic). If you were in labor, you may have already had an epidural in place which can be used in both labor and cesarean delivery. You may possibly be given medicine to make you sleep (general anesthetic) though this is not as  common. °· Your heart rate and your baby's heart rate will be monitored. °· An incision will be made in your abdomen that extends to your uterus. There are 2 basic kinds of incisions: °¨ The horizontal (transverse) incision. Horizontal incisions are from side to side and are used for most routine cesarean deliveries. °¨ The vertical incision. The vertical incision is from the top of the abdomen to the bottom and is less commonly used. It is often done for women who have a serious complication (extreme prematurity) or under emergency situations.  °¨ The horizontal and vertical incisions may both be used at the same time. However, this is very uncommon. °· An incision is then made in your uterus to deliver the baby. °· Your baby will be delivered. °· Your health care provider may place the baby on your chest. It is important to keep the baby warm. Your health care provider will dry off the baby, place the baby directly on your bare skin, and cover the baby with warm, dry blankets. °· Both incisions will be closed with absorbable stitches. °AFTER THE PROCEDURE  °· If you were awake during the surgery, you will see your baby right away. If you were asleep, you will see your baby as soon as you are awake. °· You may breastfeed your baby after surgery. °· You may be able to get up and walk the same day as the surgery. If you need to stay in bed for a period of time, you will receive help to turn, cough, and take deep breaths after   surgery. This helps prevent lung problems such as pneumonia.  Do not get out of bed alone the first time after surgery. You will need help getting out of bed until you are able to do this by yourself.  You may be able to shower the day after your cesarean delivery. After the bandage (dressing) is taken off the incision site, a nurse will assist you to shower if you would like help.  You may be directed to take actions to help prevent blood clots in your legs. These may  include:  Walking shortly after surgery, with someone assisting you. Moving around after surgery helps to improve blood flow.  Wearing compression stockings or using different types of devices.  Taking medicines to thin your blood (anticoagulants) if you are at high risk for DVT or PE.  Save any blood clots that you pass from your vagina. If you pass a clot while on the toilet, do not flush it. Call for the nurse. Tell the nurse if you think you are bleeding too much or passing too many clots.  You will be given medicine for pain and nausea as needed. Let your health care providers know if you are hurting. You may also be given an antibiotic to prevent an infection.  Your IV tube will be taken out when you are drinking a reasonable amount of fluids. The Foley catheter is taken out when you are up and walking.  If your blood type is Rh negative and your baby's blood type is Rh positive, you will be given a shot of anti-D immune globulin. This shot prevents you from having Rh problems with a future pregnancy. You should get the shot even if you had your tubes tied (tubal ligation).  If you are allowed to take the baby for a walk, place the baby in the bassinet and push it.   This information is not intended to replace advice given to you by your health care provider. Make sure you discuss any questions you have with your health care provider.   Document Released: 09/15/2005 Document Revised: 06/06/2015 Document Reviewed: 05/12/2012 Elsevier Interactive Patient Education 2016 [employer] Cesarean Delivery, Care After Refer to this sheet in the next few weeks. These instructions provide you with information on caring for yourself after your procedure. Your health care provider may also give you specific instructions. Your treatment has been planned according to current medical practices, but problems sometimes occur. Call your health care provider if you have any problems or questions after you  go home. HOME CARE INSTRUCTIONS  Only take over-the-counter or prescription medications as directed by your health care provider.  Do not drink alcohol, especially if you are breastfeeding or taking medication to relieve pain.  Do not chew or smoke tobacco.  Continue to use good perineal care. Good perineal care includes:  Wiping your perineum from front to back.  Keeping your perineum clean.  Check your surgical cut (incision) daily for increased redness, drainage, swelling, or separation of skin.  Clean your incision gently with soap and water every day, and then pat it dry. If your health care provider says it is okay, leave the incision uncovered. Use a bandage (dressing) if the incision is draining fluid or appears irritated. If the adhesive strips across the incision do not fall off within 7 days, carefully peel them off.  Hug a pillow when coughing or sneezing until your incision is healed. This helps to relieve pain.  Do not use tampons or douche  until your health care provider says it is okay.  Shower, wash your hair, and take tub baths as directed by your health care provider.  Wear a well-fitting bra that provides breast support.  Limit wearing support panties or control-top hose.  Drink enough fluids to keep your urine clear or pale yellow.  Eat high-fiber foods such as whole grain cereals and breads, brown rice, beans, and fresh fruits and vegetables every day. These foods may help prevent or relieve constipation.  Resume activities such as climbing stairs, driving, lifting, exercising, or traveling as directed by your health care provider.  Talk to your health care provider about resuming sexual activities. This is dependent upon your risk of infection, your rate of healing, and your comfort and desire to resume sexual activity.  Try to have someone help you with your household activities and your newborn for at least a few days after you leave the  hospital.  Rest as much as possible. Try to rest or take a nap when your newborn is sleeping.  Increase your activities gradually.  Keep all of your scheduled postpartum appointments. It is very important to keep your scheduled follow-up appointments. At these appointments, your health care provider will be checking to make sure that you are healing physically and emotionally. SEEK MEDICAL CARE IF:   You are passing large clots from your vagina. Save any clots to show your health care provider.  You have a foul smelling discharge from your vagina.  You have trouble urinating.  You are urinating frequently.  You have pain when you urinate.  You have a change in your bowel movements.  You have increasing redness, pain, or swelling near your incision.  You have pus draining from your incision.  Your incision is separating.  You have painful, hard, or reddened breasts.  You have a severe headache.  You have blurred vision or see spots.  You feel sad or depressed.  You have thoughts of hurting yourself or your newborn.  You have questions about your care, the care of your newborn, or medications.  You are dizzy or light-headed.  You have a rash.  You have pain, redness, or swelling at the site of the removed intravenous access (IV) tube.  You have nausea or vomiting.  You stopped breastfeeding and have not had a menstrual period within 12 weeks of stopping.  You are not breastfeeding and have not had a menstrual period within 12 weeks of delivery.  You have a fever. SEEK IMMEDIATE MEDICAL CARE IF:  You have persistent pain.  You have chest pain.  You have shortness of breath.  You faint.  You have leg pain.  You have stomach pain.  Your vaginal bleeding saturates 2 or more sanitary pads in 1 hour. MAKE SURE YOU:   Understand these instructions.  Will watch your condition.  Will get help right away if you are not doing well or get worse.   This  information is not intended to replace advice given to you by your health care provider. Make sure you discuss any questions you have with your health care provider.   Document Released: 06/07/2002 Document Revised: 10/06/2014 Document Reviewed: 05/12/2012 Elsevier Interactive Patient Education 2016 [employer] External Cephalic Version External cephalic version is turning a baby that is presenting his or her buttocks first (breech) or is lying sideways in the uterus (transverse) to a head-first position. This makes the labor and delivery faster, safer for the mother and baby, and lessens the  chance for a cesarean section. It should not be tried until the pregnancy is [redacted] weeks along or longer. BEFORE THE PROCEDURE   Do not take aspirin.  Do not eat for 4 hours before the procedure.  Tell your caregiver if you have a cold, fever, or an infection.  Tell your caregiver if you are having contractions.  Tell your caregiver if you are leaking or had a gush of fluid from your vagina.  Tell your caregiver if you have any vaginal bleeding or abnormal discharge.  If you are being admitted the same day, arrive at the hospital at least one hour before the procedure to sign any necessary documents and to get prepared for the procedure.  Tell your caregiver if you had any problems with anesthetics in the past.  Tell your caregiver if you are taking any medications that your caregiver does not know about. This includes over-the-counter and prescription drugs, herbs, eye drops and creams. PROCEDURE  First, an ultrasound is done to make sure the baby is breech or transverse.  A non-stress test or biophysical profile is done on the baby before the ECV. This is done to make sure it is safe for the baby to have the ECV. It may also be done after the procedure to make sure the baby is okay.  ECV is done in the delivery/surgical room with an anesthesiologist present. There should be a setup for an  emergency cesarean section with a full nursing and nursery staff available and ready.  The patient may be given a medication to relax the uterine muscles. An epidural may be given for any discomfort. It is helpful for the success of the ECV.  An electronic fetal monitor is placed on the uterus during the procedure to make sure the baby is okay.  If the mother is Rh-negative, Rho (D) immune globulin will be given to her to prevent Rh problems for future pregnancies.  The mother is followed closely for 2 to 3 hours after the procedure to make sure no problems develop. BENEFITS OF ECV  Easier and safer labor and delivery for the mother and baby.  Lower incidence of cesarean section.  Lower costs with a vaginal delivery. RISKS OF ECV  The placenta pulls away from the wall of the uterus before delivery (abruption of the placenta).  Rupture of the uterus, especially in patients with a previous cesarean section.  Fetal distress.  Early (premature) labor.  Premature rupture of the membranes.  The baby will return to the breech or transverse lie position.  Death of the fetus can happen but is very rare. ECV SHOULD BE STOPPED IF:  The fetal heart tones drop.  The mother is having a lot of pain.  You cannot turn the baby after several attempts. ECV SHOULD NOT BE DONE IF:  The non-stress test or biophysical profile is abnormal.  There is vaginal bleeding.  An abnormal shaped uterus is present.  There is heart disease or uncontrolled high blood pressure in the mother.  There are twins or more.  The placenta covers the opening of the cervix (placenta previa).  You had a previous cesarean section with a classical incision or major surgery of the uterus.  There is not enough amniotic fluid in the sac (oligohydramnios).  The baby is too small for the pregnancy or has not developed normally (anomaly).  Your membranes have ruptured. HOME CARE INSTRUCTIONS   Have someone take  you home after the procedure.  Rest at home  for several hours.  Have someone stay with you for a few hours after you get home.  After ECV, continue with your prenatal visits as directed.  Continue your regular diet, rest and activities.  Do not do any strenuous activities for a couple of days. SEEK IMMEDIATE MEDICAL CARE IF:   You develop vaginal bleeding.  You have fluid coming out of your vagina (bag of water may have broken).  You develop uterine contractions.  You do not feel the baby move or there is less movement of the baby.  You develop abdominal pain.  You develop an oral temperature of 102 F (38.9 C) or higher.   This information is not intended to replace advice given to you by your health care provider. Make sure you discuss any questions you have with your health care provider.   Document Released: 03/10/2007 Document Revised: 10/06/2014 Document Reviewed: 01/03/2009 Elsevier Interactive Patient Education 2016 [employer] Inc[employer] Fetal Movement Counts Patient Name: __________________________________________________ Patient Due Date: ____________________ Performing a fetal movement count is highly recommended in high-risk pregnancies, but it is good for every pregnant woman to do. Your health care provider may ask you to start counting fetal movements at 28 weeks of the pregnancy. Fetal movements often increase:  After eating a full meal.  After physical activity.  After eating or drinking something sweet or cold.  At rest. Pay attention to when you feel the baby is most active. This will help you notice a pattern of your baby's sleep and wake cycles and what factors contribute to an increase in fetal movement. It is important to perform a fetal movement count at the same time each day when your baby is normally most active.  HOW TO COUNT FETAL MOVEMENTS 1. Find a quiet and comfortable area to sit or lie down on your left side. Lying on your left side provides the  best blood and oxygen circulation to your baby. 2. Write down the day and time on a sheet of paper or in a journal. 3. Start counting kicks, flutters, swishes, rolls, or jabs in a 2-hour period. You should feel at least 10 movements within 2 hours. 4. If you do not feel 10 movements in 2 hours, wait 2-3 hours and count again. Look for a change in the pattern or not enough counts in 2 hours. SEEK MEDICAL CARE IF:  You feel less than 10 counts in 2 hours, tried twice.  There is no movement in over an hour.  The pattern is changing or taking longer each day to reach 10 counts in 2 hours.  You feel the baby is not moving as he or she usually does. Date: ____________ Movements: ____________ Start time: ____________ Doreatha MartinFinish time: ____________  Date: ____________ Movements: ____________ Start time: ____________ Doreatha MartinFinish time: ____________ Date: ____________ Movements: ____________ Start time: ____________ Doreatha MartinFinish time: ____________ Date: ____________ Movements: ____________ Start time: ____________ Doreatha MartinFinish time: ____________ Date: ____________ Movements: ____________ Start time: ____________ Doreatha MartinFinish time: ____________ Date: ____________ Movements: ____________ Start time: ____________ Doreatha MartinFinish time: ____________ Date: ____________ Movements: ____________ Start time: ____________ Doreatha MartinFinish time: ____________ Date: ____________ Movements: ____________ Start time: ____________ Doreatha MartinFinish time: ____________  Date: ____________ Movements: ____________ Start time: ____________ Doreatha MartinFinish time: ____________ Date: ____________ Movements: ____________ Start time: ____________ Doreatha MartinFinish time: ____________ Date: ____________ Movements: ____________ Start time: ____________ Doreatha MartinFinish time: ____________ Date: ____________ Movements: ____________ Start time: ____________ Doreatha MartinFinish time: ____________ Date: ____________ Movements: ____________ Start time: ____________ Doreatha MartinFinish time: ____________ Date: ____________ Movements: ____________  Start time: ____________ Doreatha MartinFinish time: ____________  Date: ____________ Movements: ____________ Start time: ____________ Doreatha Martin time: ____________  Date: ____________ Movements: ____________ Start time: ____________ Doreatha Martin time: ____________ Date: ____________ Movements: ____________ Start time: ____________ Doreatha Martin time: ____________ Date: ____________ Movements: ____________ Start time: ____________ Doreatha Martin time: ____________ Date: ____________ Movements: ____________ Start time: ____________ Doreatha Martin time: ____________ Date: ____________ Movements: ____________ Start time: ____________ Doreatha Martin time: ____________ Date: ____________ Movements: ____________ Start time: ____________ Doreatha Martin time: ____________ Date: ____________ Movements: ____________ Start time: ____________ Doreatha Martin time: ____________  Date: ____________ Movements: ____________ Start time: ____________ Doreatha Martin time: ____________ Date: ____________ Movements: ____________ Start time: ____________ Doreatha Martin time: ____________ Date: ____________ Movements: ____________ Start time: ____________ Doreatha Martin time: ____________ Date: ____________ Movements: ____________ Start time: ____________ Doreatha Martin time: ____________ Date: ____________ Movements: ____________ Start time: ____________ Doreatha Martin time: ____________ Date: ____________ Movements: ____________ Start time: ____________ Doreatha Martin time: ____________ Date: ____________ Movements: ____________ Start time: ____________ Doreatha Martin time: ____________  Date: ____________ Movements: ____________ Start time: ____________ Doreatha Martin time: ____________ Date: ____________ Movements: ____________ Start time: ____________ Doreatha Martin time: ____________ Date: ____________ Movements: ____________ Start time: ____________ Doreatha Martin time: ____________ Date: ____________ Movements: ____________ Start time: ____________ Doreatha Martin time: ____________ Date: ____________ Movements: ____________ Start time: ____________ Doreatha Martin time:  ____________ Date: ____________ Movements: ____________ Start time: ____________ Doreatha Martin time: ____________ Date: ____________ Movements: ____________ Start time: ____________ Doreatha Martin time: ____________  Date: ____________ Movements: ____________ Start time: ____________ Doreatha Martin time: ____________ Date: ____________ Movements: ____________ Start time: ____________ Doreatha Martin time: ____________ Date: ____________ Movements: ____________ Start time: ____________ Doreatha Martin time: ____________ Date: ____________ Movements: ____________ Start time: ____________ Doreatha Martin time: ____________ Date: ____________ Movements: ____________ Start time: ____________ Doreatha Martin time: ____________ Date: ____________ Movements: ____________ Start time: ____________ Doreatha Martin time: ____________ Date: ____________ Movements: ____________ Start time: ____________ Doreatha Martin time: ____________  Date: ____________ Movements: ____________ Start time: ____________ Doreatha Martin time: ____________ Date: ____________ Movements: ____________ Start time: ____________ Doreatha Martin time: ____________ Date: ____________ Movements: ____________ Start time: ____________ Doreatha Martin time: ____________ Date: ____________ Movements: ____________ Start time: ____________ Doreatha Martin time: ____________ Date: ____________ Movements: ____________ Start time: ____________ Doreatha Martin time: ____________ Date: ____________ Movements: ____________ Start time: ____________ Doreatha Martin time: ____________ Date: ____________ Movements: ____________ Start time: ____________ Doreatha Martin time: ____________  Date: ____________ Movements: ____________ Start time: ____________ Doreatha Martin time: ____________ Date: ____________ Movements: ____________ Start time: ____________ Doreatha Martin time: ____________ Date: ____________ Movements: ____________ Start time: ____________ Doreatha Martin time: ____________ Date: ____________ Movements: ____________ Start time: ____________ Doreatha Martin time: ____________ Date: ____________ Movements:  ____________ Start time: ____________ Doreatha Martin time: ____________ Date: ____________ Movements: ____________ Start time: ____________ Doreatha Martin time: ____________   This information is not intended to replace advice given to you by your health care provider. Make sure you discuss any questions you have with your health care provider.   Document Released: 10/15/2006 Document Revised: 10/06/2014 Document Reviewed: 07/12/2012 Elsevier Interactive Patient Education [employer]

## 2016-07-10 ENCOUNTER — Telehealth (HOSPITAL_COMMUNITY): Payer: Self-pay | Admitting: *Deleted

## 2016-07-10 ENCOUNTER — Encounter (HOSPITAL_COMMUNITY)
Admission: RE | Admit: 2016-07-10 | Discharge: 2016-07-10 | Disposition: A | Discharge status: Home / Self Care | Payer: BLUE CROSS/BLUE SHIELD | Source: Ambulatory Visit | Attending: Obstetrics and Gynecology | Admitting: Obstetrics and Gynecology

## 2016-07-10 LAB — CBC
HCT: 32 % — ABNORMAL LOW (ref 36.0–46.0)
Hemoglobin: 10.9 g/dL — ABNORMAL LOW (ref 12.0–15.0)
MCH: 30.4 pg (ref 26.0–34.0)
MCHC: 34.1 g/dL (ref 30.0–36.0)
MCV: 89.4 fL (ref 78.0–100.0)
PLATELETS: 180 10*3/uL (ref 150–400)
RBC: 3.58 MIL/uL — ABNORMAL LOW (ref 3.87–5.11)
RDW: 14 % (ref 11.5–15.5)
WBC: 8.3 10*3/uL (ref 4.0–10.5)

## 2016-07-10 LAB — TYPE AND SCREEN
ABO/RH(D): O POS
ABO/RH(D): O POS
ANTIBODY SCREEN: NEGATIVE
Antibody Screen: NEGATIVE

## 2016-07-10 NOTE — Patient Instructions (Signed)
[address] Cher[address] Dwan BoltM Zima  07/10/2016  Your procedure is scheduled on:  07/12/2016  [facility] at 0630 AM.     Call this number if you have problems the morning of surgery: [phone]   Remember:   Do not eat food:After Midnight.  Do not drink clear liquids: After Midnight.  Take these medicines the morning of surgery with A SIP OF WATER: none   Do not wear jewelry, make-up or nail polish.  Do not wear lotions, powders, or perfumes. Do not wear deodorant.  Do not shave 48 hours prior to surgery.  Do not bring valuables to the hospital.  [facility] is not   responsible for any belongings or valuables brought to the hospital.  Contacts, dentures or bridgework may not be worn into surgery.  Leave suitcase in the car. After surgery it may be brought to your room.  For patients admitted to the hospital, checkout time is 11:00 AM the day of              discharge.   Patients discharged the day of surgery will not be allowed to drive             home.  Name and phone number of your driver: [email or web address]  Special Instructions:   N/A   Please read over the following fact sheets that you were given:   Surgical Site Infection Prevention

## 2016-07-11 ENCOUNTER — Other Ambulatory Visit: Payer: Self-pay | Admitting: Obstetrics and Gynecology

## 2016-07-11 ENCOUNTER — Other Ambulatory Visit (HOSPITAL_COMMUNITY): Payer: Self-pay | Admitting: Certified Nurse Midwife

## 2016-07-11 LAB — RPR: RPR Ser Ql: NONREACTIVE

## 2016-07-11 NOTE — H&P (Signed)
Cheryl Curtis is a 25 y.o. female presenting for scheduled Primary Ceserean Section for Breech presentation  Pregnancy followed at [facility] since 11 weeks and remarkable for: GBS positive; Complete Breech presentation    OB History    Gravida Para Term Preterm AB Living   2       1     SAB TAB Ectopic Multiple Live Births     1           Past Medical History:  Diagnosis Date  . Asthma   . Rhinorrhea    stuffy, runny year round   Past Surgical History:  Procedure Laterality Date  . none      Family History:   family history includes Diabetes in her maternal grandmother; Heart disease in her paternal grandfather; Hypertension in her maternal aunt, mother, and paternal grandmother; Sarcoidosis in her father. Social History:    reports that she has never smoked. She has never used smokeless tobacco. She reports that she drinks alcohol. She reports that she does not use drugs.   Prenatal labs: ABO, Rh: --/--/O POS (10/12 1150) Antibody: NEG (10/12 1150) Rubella: !Error!  RPR: Non Reactive (10/12 1150)  HBsAg: Negative (03/07 0000)  HIV: Non-reactive (03/07 0000)  GBS: Positive (09/21 0000)    Prenatal Transfer Tool  Maternal Diabetes: No Genetic Screening: Normal Maternal Ultrasounds/Referrals: Normal Fetal Ultrasounds or other Referrals:  None Maternal Substance Abuse:  No Significant Maternal Medications:  Meds include: Other: iron Significant Maternal Lab Results: Lab values include: Group B Strep positive     General Appearance: Alert, appropriate appearance for age. No acute distress HEENT Exam: Grossly normal Chest/Respiratory Exam: Normal chest wall and respirations. Clear to auscultation Cardiovascular Exam: Regular rate and rhythm. S1, S2, no murmur Gastrointestinal Exam: soft, non-tender, Uterus gravid with size compatible with GA, Vertex presentation by Leopold's maneuvers Psychiatric Exam: Alert and oriented, appropriate  affect  ++++++++++++++++++++++++++++++++++++++++++++++++++++++++++++++++  Vaginal exam:   Fetal tracings:   ++++++++++++++++++++++++++++++++++++++++++++++++++++++++++++++++   Assessment/Plan:  IUP @  Void note   Cheryl Curtis CNM 07/11/2016, 5:05 PM

## 2016-07-11 NOTE — Discharge Summary (Signed)
Physician Discharge Summary  Patient ID: Haskel Khanmber M Carreno MRN: [id number] DOB/AGE: 1991/06/04 24 y.o.  Admit date: 07/09/2016 Discharge date: 07/09/2016  Admission Diagnoses:  1.  38 w 5 day EGA IUP 2.  Breech presentation  Discharge Diagnoses:  Active Problems:   Breech presentation   Discharged Condition: stable  Hospital Course: Patient presented for external cephalic versionw which was not successful. She desired a cesarean delivery and will be scheduled at [redacted] weeks EGA or later.  Meanwhile she would try moxibustion.   Consults: None  Significant Diagnostic Studies: Blood group: O pos.   Treatments: Terbutaline  Discharge Exam: There were no vitals taken for this visit. General appearance: alert, cooperative, no distress. NST: category 1 TOCO: Irregular contractions   Disposition: 01-Home or Self Care     Medication List    ASK your doctor about these medications   ferrous sulfate 325 (65 FE) MG tablet Take 325 mg by mouth at bedtime.   prenatal multivitamin Tabs tablet Take 1 tablet by mouth daily with lunch.        Follow up: Cesarean delivery at [redacted] weeks EGA.   SignedKonrad Felix: Divine Imber WAKURU, MD 07/11/2016, 11:44 AM

## 2016-07-12 ENCOUNTER — Encounter (HOSPITAL_COMMUNITY)
Admission: AD | Disposition: A | Discharge status: Home / Self Care | Payer: Self-pay | Source: Ambulatory Visit | Attending: Obstetrics and Gynecology

## 2016-07-12 ENCOUNTER — Encounter (HOSPITAL_COMMUNITY): Payer: Self-pay | Admitting: *Deleted

## 2016-07-12 ENCOUNTER — Inpatient Hospital Stay (HOSPITAL_COMMUNITY): Payer: BLUE CROSS/BLUE SHIELD | Admitting: Anesthesiology

## 2016-07-12 ENCOUNTER — Inpatient Hospital Stay (HOSPITAL_COMMUNITY)
Admission: AD | Admit: 2016-07-12 | Discharge: 2016-07-15 | DRG: 766 | Disposition: A | Discharge status: Home / Self Care | Payer: BLUE CROSS/BLUE SHIELD | Source: Ambulatory Visit | Attending: Obstetrics and Gynecology | Admitting: Obstetrics and Gynecology

## 2016-07-12 ENCOUNTER — Inpatient Hospital Stay (HOSPITAL_COMMUNITY)
Admission: RE | Admit: 2016-07-12 | Payer: BLUE CROSS/BLUE SHIELD | Source: Ambulatory Visit | Admitting: Obstetrics and Gynecology

## 2016-07-12 DIAGNOSIS — Z833 Family history of diabetes mellitus: Secondary | ICD-10-CM | POA: Diagnosis not present

## 2016-07-12 DIAGNOSIS — Z8249 Family history of ischemic heart disease and other diseases of the circulatory system: Secondary | ICD-10-CM | POA: Diagnosis not present

## 2016-07-12 DIAGNOSIS — O321XX0 Maternal care for breech presentation, not applicable or unspecified: Secondary | ICD-10-CM | POA: Diagnosis not present

## 2016-07-12 DIAGNOSIS — O329XX0 Maternal care for malpresentation of fetus, unspecified, not applicable or unspecified: Secondary | ICD-10-CM | POA: Diagnosis present

## 2016-07-12 DIAGNOSIS — O99824 Streptococcus B carrier state complicating childbirth: Secondary | ICD-10-CM | POA: Diagnosis present

## 2016-07-12 DIAGNOSIS — O321XX1 Maternal care for breech presentation, fetus 1: Secondary | ICD-10-CM | POA: Diagnosis not present

## 2016-07-12 DIAGNOSIS — Z3A39 39 weeks gestation of pregnancy: Secondary | ICD-10-CM | POA: Diagnosis not present

## 2016-07-12 SURGERY — Surgical Case
Anesthesia: Spinal | Wound class: Clean Contaminated

## 2016-07-12 MED ORDER — NALBUPHINE HCL 10 MG/ML IJ SOLN: 5.0000 mg | INTRAMUSCULAR | Status: DC | PRN

## 2016-07-12 MED ORDER — NALBUPHINE HCL 10 MG/ML IJ SOLN: 5.0000 mg | Freq: Once | INTRAMUSCULAR | Status: DC | PRN

## 2016-07-12 MED ORDER — DEXAMETHASONE SODIUM PHOSPHATE 10 MG/ML IJ SOLN
INTRAMUSCULAR | Status: DC | PRN
  Administered 2016-07-12: 10 mg via INTRAVENOUS

## 2016-07-12 MED ORDER — IBUPROFEN 600 MG PO TABS: 600.0000 mg | ORAL_TABLET | Freq: Four times a day (QID) | ORAL | Status: DC | PRN

## 2016-07-12 MED ORDER — DEXAMETHASONE SODIUM PHOSPHATE 10 MG/ML IJ SOLN
INTRAMUSCULAR | Status: AC
  Filled 2016-07-12: qty 1

## 2016-07-12 MED ORDER — OXYTOCIN 40 UNITS IN LACTATED RINGERS INFUSION - SIMPLE MED: 2.5000 [IU]/h | INTRAVENOUS | Status: AC

## 2016-07-12 MED ORDER — COCONUT OIL OIL
1.0000 | TOPICAL_OIL | Status: DC | PRN
Start: 2016-07-12 — End: 2016-07-15
  Administered 2016-07-13: 1 via TOPICAL
  Filled 2016-07-12: qty 120

## 2016-07-12 MED ORDER — CEFAZOLIN SODIUM-DEXTROSE 2-4 GM/100ML-% IV SOLN
2.0000 g | INTRAVENOUS | Status: AC
  Administered 2016-07-12 (×2): 2 g via INTRAVENOUS
  Filled 2016-07-12: qty 100

## 2016-07-12 MED ORDER — TETANUS-DIPHTH-ACELL PERTUSSIS 5-2.5-18.5 LF-MCG/0.5 IM SUSP: 0.5000 mL | Freq: Once | INTRAMUSCULAR | Status: DC

## 2016-07-12 MED ORDER — BUPIVACAINE HCL (PF) 0.25 % IJ SOLN
INTRAMUSCULAR | Status: DC | PRN
  Administered 2016-07-12: 20 mL

## 2016-07-12 MED ORDER — OXYTOCIN 40 UNITS IN LACTATED RINGERS INFUSION - SIMPLE MED
INTRAVENOUS | Status: DC | PRN
  Administered 2016-07-12: 40 [IU] via INTRAVENOUS

## 2016-07-12 MED ORDER — IBUPROFEN 600 MG PO TABS
600.0000 mg | ORAL_TABLET | Freq: Four times a day (QID) | ORAL | Status: DC
Start: 2016-07-12 — End: 2016-07-15
  Administered 2016-07-12 – 2016-07-15 (×10): 600 mg via ORAL
  Filled 2016-07-12 (×11): qty 1

## 2016-07-12 MED ORDER — ONDANSETRON HCL 4 MG/2ML IJ SOLN
INTRAMUSCULAR | Status: AC
  Filled 2016-07-12: qty 2

## 2016-07-12 MED ORDER — SCOPOLAMINE 1 MG/3DAYS TD PT72
1.0000 | MEDICATED_PATCH | Freq: Once | TRANSDERMAL | Status: DC
  Filled 2016-07-12: qty 1

## 2016-07-12 MED ORDER — MORPHINE SULFATE-NACL 0.5-0.9 MG/ML-% IV SOSY
PREFILLED_SYRINGE | INTRAVENOUS | Status: AC
  Filled 2016-07-12: qty 1

## 2016-07-12 MED ORDER — KETOROLAC TROMETHAMINE 30 MG/ML IJ SOLN
INTRAMUSCULAR | Status: AC
  Filled 2016-07-12: qty 1

## 2016-07-12 MED ORDER — BUPIVACAINE HCL (PF) 0.25 % IJ SOLN
INTRAMUSCULAR | Status: AC
  Filled 2016-07-12: qty 20

## 2016-07-12 MED ORDER — SCOPOLAMINE 1 MG/3DAYS TD PT72
MEDICATED_PATCH | TRANSDERMAL | Status: AC
  Filled 2016-07-12: qty 1

## 2016-07-12 MED ORDER — ACETAMINOPHEN 500 MG PO TABS
1000.0000 mg | ORAL_TABLET | Freq: Four times a day (QID) | ORAL | Status: AC
  Administered 2016-07-12 – 2016-07-13 (×3): 1000 mg via ORAL
  Filled 2016-07-12 (×3): qty 2

## 2016-07-12 MED ORDER — KETOROLAC TROMETHAMINE 30 MG/ML IJ SOLN: 30.0000 mg | Freq: Four times a day (QID) | INTRAMUSCULAR | Status: DC | PRN

## 2016-07-12 MED ORDER — WITCH HAZEL-GLYCERIN EX PADS: 1.0000 "application " | MEDICATED_PAD | CUTANEOUS | Status: DC | PRN

## 2016-07-12 MED ORDER — DIPHENHYDRAMINE HCL 25 MG PO CAPS
25.0000 mg | ORAL_CAPSULE | ORAL | Status: DC | PRN
  Filled 2016-07-12: qty 1

## 2016-07-12 MED ORDER — MEPERIDINE HCL 25 MG/ML IJ SOLN: 6.2500 mg | INTRAMUSCULAR | Status: DC | PRN

## 2016-07-12 MED ORDER — ACETAMINOPHEN 325 MG PO TABS
650.0000 mg | ORAL_TABLET | ORAL | Status: DC | PRN
  Administered 2016-07-13 – 2016-07-14 (×5): 650 mg via ORAL
  Filled 2016-07-12 (×5): qty 2

## 2016-07-12 MED ORDER — KETOROLAC TROMETHAMINE 30 MG/ML IJ SOLN: 30.0000 mg | Freq: Once | INTRAMUSCULAR | Status: DC

## 2016-07-12 MED ORDER — SENNOSIDES-DOCUSATE SODIUM 8.6-50 MG PO TABS
2.0000 | ORAL_TABLET | ORAL | Status: DC
  Administered 2016-07-12 – 2016-07-14 (×3): 2 via ORAL
  Filled 2016-07-12 (×3): qty 2

## 2016-07-12 MED ORDER — SIMETHICONE 80 MG PO CHEW: 80.0000 mg | CHEWABLE_TABLET | ORAL | Status: DC | PRN

## 2016-07-12 MED ORDER — OXYCODONE-ACETAMINOPHEN 5-325 MG PO TABS: 1.0000 | ORAL_TABLET | ORAL | Status: DC | PRN

## 2016-07-12 MED ORDER — KETOROLAC TROMETHAMINE 30 MG/ML IJ SOLN
30.0000 mg | Freq: Four times a day (QID) | INTRAMUSCULAR | Status: DC | PRN
  Administered 2016-07-12: 30 mg via INTRAMUSCULAR

## 2016-07-12 MED ORDER — OXYCODONE-ACETAMINOPHEN 5-325 MG PO TABS: 2.0000 | ORAL_TABLET | ORAL | Status: DC | PRN

## 2016-07-12 MED ORDER — MORPHINE SULFATE-NACL 0.5-0.9 MG/ML-% IV SOSY
PREFILLED_SYRINGE | INTRAVENOUS | Status: DC | PRN
  Administered 2016-07-12: .2 mg via INTRATHECAL

## 2016-07-12 MED ORDER — MENTHOL 3 MG MT LOZG: 1.0000 | LOZENGE | OROMUCOSAL | Status: DC | PRN

## 2016-07-12 MED ORDER — LACTATED RINGERS IV SOLN
INTRAVENOUS | Status: DC
  Administered 2016-07-12 (×3): via INTRAVENOUS

## 2016-07-12 MED ORDER — PHENYLEPHRINE 8 MG IN D5W 100 ML (0.08MG/ML) PREMIX OPTIME
INJECTION | INTRAVENOUS | Status: DC | PRN
  Administered 2016-07-12: 60 ug/min via INTRAVENOUS

## 2016-07-12 MED ORDER — SODIUM CHLORIDE 0.9 % IR SOLN
Status: DC | PRN
  Administered 2016-07-12: 1000 mL

## 2016-07-12 MED ORDER — ONDANSETRON HCL 4 MG/2ML IJ SOLN
INTRAMUSCULAR | Status: DC | PRN
  Administered 2016-07-12: 4 mg via INTRAVENOUS

## 2016-07-12 MED ORDER — NALOXONE HCL 2 MG/2ML IJ SOSY
1.0000 ug/kg/h | PREFILLED_SYRINGE | INTRAVENOUS | Status: DC | PRN
  Filled 2016-07-12: qty 2

## 2016-07-12 MED ORDER — DIPHENHYDRAMINE HCL 50 MG/ML IJ SOLN: 12.5000 mg | INTRAMUSCULAR | Status: DC | PRN

## 2016-07-12 MED ORDER — SOD CITRATE-CITRIC ACID 500-334 MG/5ML PO SOLN
30.0000 mL | ORAL | Status: AC
  Administered 2016-07-12: 30 mL via ORAL
  Filled 2016-07-12: qty 15

## 2016-07-12 MED ORDER — HYDROMORPHONE HCL 1 MG/ML IJ SOLN: 0.2500 mg | INTRAMUSCULAR | Status: DC | PRN

## 2016-07-12 MED ORDER — OXYTOCIN 10 UNIT/ML IJ SOLN
INTRAMUSCULAR | Status: AC
  Filled 2016-07-12: qty 4

## 2016-07-12 MED ORDER — PRENATAL MULTIVITAMIN CH
1.0000 | ORAL_TABLET | Freq: Every day | ORAL | Status: DC
  Administered 2016-07-13 – 2016-07-14 (×2): 1 via ORAL
  Filled 2016-07-12 (×2): qty 1

## 2016-07-12 MED ORDER — MEASLES, MUMPS & RUBELLA VAC ~~LOC~~ INJ
0.5000 mL | INJECTION | Freq: Once | SUBCUTANEOUS | Status: DC
  Filled 2016-07-12: qty 0.5

## 2016-07-12 MED ORDER — ONDANSETRON HCL 4 MG/2ML IJ SOLN: 4.0000 mg | Freq: Three times a day (TID) | INTRAMUSCULAR | Status: DC | PRN

## 2016-07-12 MED ORDER — NALOXONE HCL 0.4 MG/ML IJ SOLN: 0.4000 mg | INTRAMUSCULAR | Status: DC | PRN

## 2016-07-12 MED ORDER — SIMETHICONE 80 MG PO CHEW
80.0000 mg | CHEWABLE_TABLET | Freq: Three times a day (TID) | ORAL | Status: DC
  Administered 2016-07-13 – 2016-07-15 (×7): 80 mg via ORAL
  Filled 2016-07-12 (×7): qty 1

## 2016-07-12 MED ORDER — PROMETHAZINE HCL 25 MG/ML IJ SOLN: 6.2500 mg | INTRAMUSCULAR | Status: DC | PRN

## 2016-07-12 MED ORDER — FENTANYL CITRATE (PF) 100 MCG/2ML IJ SOLN
INTRAMUSCULAR | Status: DC | PRN
  Administered 2016-07-12: 12.5 ug via INTRATHECAL

## 2016-07-12 MED ORDER — METHYLERGONOVINE MALEATE 0.2 MG PO TABS: 0.2000 mg | ORAL_TABLET | ORAL | Status: DC | PRN

## 2016-07-12 MED ORDER — SIMETHICONE 80 MG PO CHEW
80.0000 mg | CHEWABLE_TABLET | ORAL | Status: DC
  Administered 2016-07-12 – 2016-07-14 (×3): 80 mg via ORAL
  Filled 2016-07-12 (×3): qty 1

## 2016-07-12 MED ORDER — FERROUS SULFATE 325 (65 FE) MG PO TABS
325.0000 mg | ORAL_TABLET | Freq: Two times a day (BID) | ORAL | Status: DC
  Administered 2016-07-13 – 2016-07-15 (×4): 325 mg via ORAL
  Filled 2016-07-12 (×5): qty 1

## 2016-07-12 MED ORDER — PHENYLEPHRINE 40 MCG/ML (10ML) SYRINGE FOR IV PUSH (FOR BLOOD PRESSURE SUPPORT)
PREFILLED_SYRINGE | INTRAVENOUS | Status: AC
  Filled 2016-07-12: qty 10

## 2016-07-12 MED ORDER — SODIUM CHLORIDE 0.9% FLUSH: 3.0000 mL | INTRAVENOUS | Status: DC | PRN

## 2016-07-12 MED ORDER — BUPIVACAINE IN DEXTROSE 0.75-8.25 % IT SOLN
INTRATHECAL | Status: DC | PRN
  Administered 2016-07-12: 1.6 mL via INTRATHECAL

## 2016-07-12 MED ORDER — METHYLERGONOVINE MALEATE 0.2 MG/ML IJ SOLN: 0.2000 mg | INTRAMUSCULAR | Status: DC | PRN

## 2016-07-12 MED ORDER — DIBUCAINE 1 % RE OINT: 1.0000 "application " | TOPICAL_OINTMENT | RECTAL | Status: DC | PRN

## 2016-07-12 MED ORDER — ZOLPIDEM TARTRATE 5 MG PO TABS: 5.0000 mg | ORAL_TABLET | Freq: Every evening | ORAL | Status: DC | PRN

## 2016-07-12 MED ORDER — LACTATED RINGERS IV SOLN
INTRAVENOUS | Status: DC | PRN
  Administered 2016-07-12: 14:00:00 via INTRAVENOUS
  Administered 2016-07-12: 1000 mL

## 2016-07-12 MED ORDER — DIPHENHYDRAMINE HCL 25 MG PO CAPS: 25.0000 mg | ORAL_CAPSULE | Freq: Four times a day (QID) | ORAL | Status: DC | PRN

## 2016-07-12 MED ORDER — FENTANYL CITRATE (PF) 100 MCG/2ML IJ SOLN
INTRAMUSCULAR | Status: AC
  Filled 2016-07-12: qty 2

## 2016-07-12 SURGICAL SUPPLY — 40 items
APL SKNCLS STERI-STRIP NONHPOA (GAUZE/BANDAGES/DRESSINGS) ×1
BENZOIN TINCTURE PRP APPL 2/3 (GAUZE/BANDAGES/DRESSINGS) ×3 IMPLANT
BOOTIES KNEE HIGH SLOAN (MISCELLANEOUS) ×6 IMPLANT
CHLORAPREP W/TINT 26ML (MISCELLANEOUS) ×3 IMPLANT
CLAMP CORD UMBIL (MISCELLANEOUS) IMPLANT
CLOSURE STERI STRIP 1/2 X4 (GAUZE/BANDAGES/DRESSINGS) ×2 IMPLANT
CLOTH BEACON ORANGE TIMEOUT ST (SAFETY) ×3 IMPLANT
DRAIN JACKSON PRT FLT 10 (DRAIN) IMPLANT
DRSG OPSITE POSTOP 4X10 (GAUZE/BANDAGES/DRESSINGS) ×3 IMPLANT
DRSG PAD ABDOMINAL 8X10 ST (GAUZE/BANDAGES/DRESSINGS) ×2 IMPLANT
ELECT REM PT RETURN 9FT ADLT (ELECTROSURGICAL) ×3
ELECTRODE REM PT RTRN 9FT ADLT (ELECTROSURGICAL) ×1 IMPLANT
EVACUATOR SILICONE 100CC (DRAIN) IMPLANT
EXTRACTOR VACUUM M CUP 4 TUBE (SUCTIONS) IMPLANT
EXTRACTOR VACUUM M CUP 4' TUBE (SUCTIONS)
GLOVE BIOGEL PI IND STRL 7.0 (GLOVE) ×2 IMPLANT
GLOVE BIOGEL PI INDICATOR 7.0 (GLOVE) ×4
GLOVE ECLIPSE 6.5 STRL STRAW (GLOVE) ×3 IMPLANT
GOWN STRL REUS W/TWL LRG LVL3 (GOWN DISPOSABLE) ×6 IMPLANT
KIT ABG SYR 3ML LUER SLIP (SYRINGE) IMPLANT
NDL HYPO 25X5/8 SAFETYGLIDE (NEEDLE) IMPLANT
NEEDLE HYPO 22GX1.5 SAFETY (NEEDLE) ×3 IMPLANT
NEEDLE HYPO 25X5/8 SAFETYGLIDE (NEEDLE) IMPLANT
NS IRRIG 1000ML POUR BTL (IV SOLUTION) ×6 IMPLANT
PACK C SECTION WH (CUSTOM PROCEDURE TRAY) ×3 IMPLANT
PAD OB MATERNITY 4.3X12.25 (PERSONAL CARE ITEMS) ×3 IMPLANT
PENCIL SMOKE EVAC W/HOLSTER (ELECTROSURGICAL) ×3 IMPLANT
RTRCTR C-SECT PINK 25CM LRG (MISCELLANEOUS) ×3 IMPLANT
SPONGE DRAIN TRACH 4X4 STRL 2S (GAUZE/BANDAGES/DRESSINGS) ×4 IMPLANT
STRIP CLOSURE SKIN 1/2X4 (GAUZE/BANDAGES/DRESSINGS) ×2 IMPLANT
SUT MNCRL AB 3-0 PS2 27 (SUTURE) ×3 IMPLANT
SUT SILK 2 0 FSL 18 (SUTURE) IMPLANT
SUT VIC AB 0 CTX 36 (SUTURE) ×9
SUT VIC AB 0 CTX36XBRD ANBCTRL (SUTURE) ×3 IMPLANT
SUT VIC AB 1 CT1 36 (SUTURE) ×6 IMPLANT
SUT VIC AB 2-0 CT1 27 (SUTURE)
SUT VIC AB 2-0 CT1 TAPERPNT 27 (SUTURE) IMPLANT
SYR 20CC LL (SYRINGE) ×3 IMPLANT
TOWEL OR 17X24 6PK STRL BLUE (TOWEL DISPOSABLE) ×3 IMPLANT
TRAY FOLEY CATH SILVER 14FR (SET/KITS/TRAYS/PACK) ×3 IMPLANT

## 2016-07-12 NOTE — Consult Note (Signed)
Neonatology Note:   Attendance at C-section:    I was asked by Dr. Rivard to attend this primary C/S at term for breech. The mother is a G2P0010, GBS pos with good prenatal care. No Labor or fever.  ROM 0 hours before delivery, fluid clear. Infant vigorous with good spontaneous cry and tone. + delayed cord clamping 30sec.  Needed only minimal bulb suctioning. Ap 8/9. Lungs clear to ausc in DR. To CN to care of Pediatrician.  Myan Suit C. Oneal Biglow, MD 

## 2016-07-12 NOTE — Anesthesia Preprocedure Evaluation (Addendum)
Anesthesia Evaluation  Patient identified by MRN, date of birth, ID band Patient awake    Reviewed: Allergy & Precautions, H&P , NPO status , Patient's Chart, lab work & pertinent test results  Airway Mallampati: II  TM Distance: >3 FB Neck ROM: full    Dental no notable dental hx.    Pulmonary    Pulmonary exam normal        Cardiovascular negative cardio ROS Normal cardiovascular exam     Neuro/Psych negative neurological ROS  negative psych ROS   GI/Hepatic negative GI ROS, Neg liver ROS,   Endo/Other  negative endocrine ROS  Renal/GU negative Renal ROS     Musculoskeletal   Abdominal (+) + obese,   Peds  Hematology negative hematology ROS (+)   Anesthesia Other Findings   Reproductive/Obstetrics (+) Pregnancy                            Anesthesia Physical Anesthesia Plan  ASA: II  Anesthesia Plan: Spinal   Post-op Pain Management:    Induction:   Airway Management Planned:   Additional Equipment:   Intra-op Plan:   Post-operative Plan:   Informed Consent: I have reviewed the patients History and Physical, chart, labs and discussed the procedure including the risks, benefits and alternatives for the proposed anesthesia with the patient or authorized representative who has indicated his/her understanding and acceptance.     Plan Discussed with: CRNA and Surgeon  Anesthesia Plan Comments:        Anesthesia Quick Evaluation

## 2016-07-12 NOTE — Op Note (Signed)
Preoperative diagnosis: Intrauterine pregnancy at 39 weeks and 1 day with complete breech presentation  Post operative diagnosis: Same  Anesthesia: Spinal  Anesthesiologist: Dr. Arby BarretteHatchett  Procedure: Primary low transverse cesarean section  Surgeon: Dr. Dois DavenportSandra Webb Weed  Assistant: Illene BolusLori Clemmons CNM  Estimated blood loss: 600 cc  Procedure:  After being informed of the planned procedure and possible complications including bleeding, infection, injury to other organs, informed consent is obtained. The patient is taken to OR #9 and given spinal anesthesia without complication. She is placed in the dorsal decubitus position with the pelvis tilted to the left. She is then prepped and draped in a sterile fashion. A Foley catheter is inserted in her bladder.  After assessing adequate level of anesthesia, we infiltrate the suprapubic area with 20 cc of Marcaine 0.25 and perform a Pfannenstiel incision which is brought down sharply to the fascia. The fascia is entered in a low transverse fashion. Linea alba is dissected. Peritoneum is entered in a midline fashion. An Alexis retractor is easily positioned.   The myometrium is then entered in a low transverse fashion, 2 cm above the vesico-uterine junction ; first with knife and then extended bluntly. Amniotic fluid is clear. We assist the birth of a female  infant in complete breech  presentation.  The baby is delivered. The cord is clamped and sectioned. Mouth and nose are suctioned.The baby is given to the neonatologist present in the room.  10 cc of blood is drawn from the umbilical vein.The placenta is allowed to deliver spontaneously. It is complete and the cord has 3 vessels. Uterine revision is negative.  We proceed with closure of the myometrium in 2 layers: First with a running locked suture of 0 Vicryl, then with a Lembert suture of 0 Vicryl imbricating the first one. Hemostasis is completed with cauterization on peritoneal edges.  Both  paracolic gutters are cleaned. Both tubes and ovaries are assessed and normal. The pelvis is profusely irrigated with warm saline to confirm a satisfactory hemostasis.  Retractors and sponges are removed. Under fascia hemostasis is completed with cauterization. The fascia is then closed with 2 running sutures of 0 Vicryl meeting midline. The wound is irrigated with warm saline and hemostasis is completed with cauterization. The skin is closed with a subcuticular suture of 3-0 Monocryl and Steri-Strips.  Instrument and sponge count is complete x2. Estimated blood loss is 600 cc.  The procedure is well tolerated by the patient who is taken to recovery room in a well and stable condition.  female baby named [name] was born at 13:44 and received an Apgar of 8  at 1 minute and 9 at 5 minutes.    Specimen: Placenta sent to L & D   Shiven Junious A MD 10/14/20173:19 PM

## 2016-07-12 NOTE — Anesthesia Procedure Notes (Signed)
Spinal  Patient location during procedure: OR Start time: 07/12/2016 1:14 PM End time: 07/12/2016 1:16 PM Staffing Anesthesiologist: Leilani AbleHATCHETT, Irven Ingalsbe Performed: anesthesiologist  Preanesthetic Checklist Completed: patient identified, surgical consent, pre-op evaluation, timeout performed, IV checked, risks and benefits discussed and monitors and equipment checked Spinal Block Patient position: sitting Prep: site prepped and draped and DuraPrep Patient monitoring: heart rate, cardiac monitor, continuous pulse ox and blood pressure Approach: midline Location: L3-4 Injection technique: single-shot Needle Needle type: Sprotte  Needle gauge: 24 G Needle length: 9 cm Needle insertion depth: 6 cm Assessment Sensory level: T4

## 2016-07-12 NOTE — Interval H&P Note (Signed)
History and Physical Interval Note:  07/12/2016 12:57 PM  Cheryl Curtis  has presented today for surgery, with the diagnosis of Breech Presentation  The various methods of treatment have been discussed with the patient and family. After consideration of risks, benefits and other options for treatment, the patient has consented to  Procedure(s): CESAREAN SECTION (N/A) as a surgical intervention .  The patient's history has been reviewed, patient examined, no change in status, stable for surgery.  I have reviewed the patient's chart and labs.  Questions were answered to the patient's satisfaction.     Mikah Poss A

## 2016-07-12 NOTE — Transfer of Care (Signed)
Immediate Anesthesia Transfer of Care Note  Patient: Cheryl Curtis  Procedure(s) Performed: Procedure(s): CESAREAN SECTION (N/A)  Patient Location: PACU  Anesthesia Type:Spinal  Level of Consciousness: awake, alert  and oriented  Airway & Oxygen Therapy: Patient Spontanous Breathing  Post-op Assessment: Report given to RN and Post -op Vital signs reviewed and stable  Post vital signs: Reviewed and stable  Last Vitals:  Vitals:   07/12/16 1026 07/12/16 1205  BP: 122/78 112/81  Pulse: 93 80  Resp: 18 18  Temp: 36.8 C 36.7 C    Last Pain:  Vitals:   07/12/16 1205  TempSrc: Oral         Complications: No apparent anesthesia complications

## 2016-07-12 NOTE — H&P (Addendum)
Cheryl Curtis is a 25 y.o. G2P0010 @[redacted]w[redacted]d  female presenting for Primary Ceserean Section for Breech  Pregnancy followed at [facility] since 11 weeks and remarkable for:  GBS Positive Breech Presentation  OB History    Gravida Para Term Preterm AB Living   2       1     SAB TAB Ectopic Multiple Live Births     1           Past Medical History:  Diagnosis Date  . Asthma   . Rhinorrhea    stuffy, runny year round   Past Surgical History:  Procedure Laterality Date  . none      Family History:   family history includes Diabetes in her maternal grandmother; Heart disease in her paternal grandfather; Hypertension in her maternal aunt, mother, and paternal grandmother; Sarcoidosis in her father. Social History:    reports that she has never smoked. She has never used smokeless tobacco. She reports that she drinks alcohol. She reports that she does not use drugs.   Prenatal labs: ABO, Rh: --/--/O POS (10/12 1150) Antibody: NEG (10/12 1150) Rubella: !Error! RPR: Non Reactive (10/12 1150)  HBsAg: Negative (03/07 0000)  HIV: Non-reactive (03/07 0000)  GBS: Positive (09/21 0000)    Prenatal Transfer Tool  Maternal Diabetes: No Genetic Screening: Normal Maternal Ultrasounds/Referrals: Normal Fetal Ultrasounds or other Referrals:  None Maternal Substance Abuse:  No Significant Maternal Medications:  None Significant Maternal Lab Results: Lab values include: Group B Strep positive     Blood pressure 122/78, pulse 93, temperature 98.2 F (36.8 C), temperature source Oral, resp. rate 18, height 5\' 7"  (1.702 m), weight 221 lb (100.2 kg).  General Appearance: Alert, appropriate appearance for age. No acute distress HEENT Exam: Grossly normal Chest/Respiratory Exam: Normal chest wall and respirations. Clear to auscultation Cardiovascular Exam: Regular rate and rhythm. S1, S2, no murmur Gastrointestinal Exam: soft, non-tender, Uterus gravid with size compatible with GA, Vertex  presentation by Leopold's maneuvers Psychiatric Exam: Alert and oriented, appropriate affect  Fetal tracings: Category 1  ++++++++++++++++++++++++++++++++++++++++++++++++++++++++++++++++   Assessment/Plan: IUP @ 39+1 Primary C/S for Breech  Illene BolusLori Clemmons CNM 07/12/2016, 11:23 AM    Cesarean section reviewed with pt with R&B including but not limited to:  bleeding, infection, injury to other organs. Low transverse approach planned which will allow vaginal delivery with future pregnancies. Should a vertical incision or inverted T be needed, patient is aware that repeat cesarean sections would be recommended in the future. Expected hospital stay and recovery also discussed.

## 2016-07-12 NOTE — Anesthesia Postprocedure Evaluation (Signed)
Anesthesia Post Note  Patient: Cheryl Curtis  Procedure(s) Performed: Procedure(s) (LRB): CESAREAN SECTION (N/A)  Patient location during evaluation: PACU Anesthesia Type: Spinal Level of consciousness: awake Pain management: pain level controlled Vital Signs Assessment: post-procedure vital signs reviewed and stable Respiratory status: spontaneous breathing Cardiovascular status: stable Postop Assessment: no headache, no backache, spinal receding, patient able to bend at knees and no signs of nausea or vomiting Anesthetic complications: no     Last Vitals:  Vitals:   07/12/16 1630 07/12/16 1646  BP: 121/73 124/61  Pulse: 70 61  Resp: 18 18  Temp:  36.6 C    Last Pain:  Vitals:   07/12/16 1646  TempSrc: Axillary   Pain Goal:                 Ambert Virrueta JR,JOHN Christoffer Currier

## 2016-07-13 LAB — CBC
HEMATOCRIT: 29.1 % — AB (ref 36.0–46.0)
Hemoglobin: 10 g/dL — ABNORMAL LOW (ref 12.0–15.0)
MCH: 30.8 pg (ref 26.0–34.0)
MCHC: 34.4 g/dL (ref 30.0–36.0)
MCV: 89.5 fL (ref 78.0–100.0)
Platelets: 194 10*3/uL (ref 150–400)
RBC: 3.25 MIL/uL — ABNORMAL LOW (ref 3.87–5.11)
RDW: 13.6 % (ref 11.5–15.5)
WBC: 14.8 10*3/uL — AB (ref 4.0–10.5)

## 2016-07-13 MED ORDER — LACTATED RINGERS IV SOLN
INTRAVENOUS | Status: DC
  Administered 2016-07-13: 999 mL via INTRAVENOUS

## 2016-07-13 NOTE — Lactation Note (Signed)
This note was copied from a baby's chart. Lactation Consultation Note  Patient Name: Cheryl Curtis Cootermber Shan NGEXB'MToday's Date: 07/13/2016 Reason for consult: Initial assessment   Initial assessment with first time mom of 11 hour old infant. Mom reports infant has been feeding well and often. Infant was laying in bassinet and cueing to feed.  Mom attempted to latch infant to left breast in the cross cradle hold, he latched on and off. She then latched him in cross cradle to the right breast and he latched and fed for 10 minutes. We attempted football hold to left breast and did not get infant latched. Infant fed for 10 minutes and then fell asleep. Enc mom to feed 8-12 x in 24 hours at first feeding cues. Discussed supply and demand, colostrum, milk coming to volume, cluster feeding positioning and pillow support.  Mom with compressible breasts and areolas with short shaft nipples.   BF Resources Handout and LC Brochure given, mom informed of IP/OP Services, BF Support Groups and LC phone #. Enc mom to call out to desk with questions/concerns or for assistance with feeding. Follow up tomorrow and prn.   Maternal Data Formula Feeding for Exclusion: No Does the patient have breastfeeding experience prior to this delivery?: No  Feeding Feeding Type: Breast Fed Length of feed: 10 min  LATCH Score/Interventions Latch: Grasps breast easily, tongue down, lips flanged, rhythmical sucking.  Audible Swallowing: A few with stimulation Intervention(s): Alternate breast massage;Skin to skin;Hand expression  Type of Nipple: Everted at rest and after stimulation  Comfort (Breast/Nipple): Soft / non-tender     Hold (Positioning): No assistance needed to correctly position infant at breast. Intervention(s): Breastfeeding basics reviewed;Support Pillows;Position options;Skin to skin  LATCH Score: 9  Lactation Tools Discussed/Used WIC Program: No   Consult Status Consult Status: Follow-up Date:  07/14/16 Follow-up type: In-patient    Silas FloodSharon S Bralyn Espino 07/13/2016, 12:57 AM

## 2016-07-13 NOTE — Progress Notes (Addendum)
2Subjective: Postpartum Day 1: Cesarean Delivery Patient reports tolerating PO, + flatus and no problems voiding.    Objective: Vital signs in last 24 hours: Temp:  [96.9 F (36.1 C)-98.6 F (37 C)] 98.6 F (37 C) (10/15 0800) Pulse Rate:  [52-93] 71 (10/15 0800) Resp:  [15-19] 18 (10/15 0800) BP: (96-138)/(57-81) 118/66 (10/15 0800) SpO2:  [95 %-99 %] 98 % (10/15 0800) Weight:  [100.2 kg (221 lb)] 100.2 kg (221 lb) (10/14 1026)  Physical Exam:  General: alert, orientated with no distress Lochia: appropriate Uterine Fundus: firm Incision: healing well, dressing saturated with old blood will place new dressing on DVT Evaluation: No evidence of DVT seen on physical exam.   Recent Labs  07/10/16 1150 07/13/16 0522  HGB 10.9* 10.0*  HCT 32.0* 29.1*    Assessment/Plan: Status post Cesarean section. Doing well postoperatively.  Continue current care.  Cheryl Curtis 07/13/2016, 10:12 AM   I saw and examined patient at bedside and agree with above findings, assessment and plan.  Patient desires circumcision of neonate.  I discussed with patient risks, benefits and alternatives of circumcision including risks of bleeding, infection, damage to organs and need for further procedures.  All her questions were answered and she consented for the procedure to be done on her son.This procedure has been fully reviewed with the patient and written informed consent has been obtained. Dr. Sallye OberKulwa.

## 2016-07-14 NOTE — Addendum Note (Signed)
Addendum  created 07/14/16 1314 by Shanon PayorSuzanne M Jaidan Prevette, CRNA   Sign clinical note

## 2016-07-14 NOTE — Progress Notes (Signed)
Subjective: Postpartum Day 2: Cesarean Delivery Patient reports tolerating PO, + flatus, + BM and no problems voiding.    Objective: Vital signs in last 24 hours: Temp:  [98.5 F (36.9 C)-99 F (37.2 C)] 98.5 F (36.9 C) (10/16 0549) Pulse Rate:  [88-95] 88 (10/16 0549) Resp:  [18-20] 18 (10/16 0549) BP: (127-133)/(70-79) 133/79 (10/16 0549) SpO2:  [98 %] 98 % (10/15 1900)  Physical Exam:  General: alert and no distress Lochia: appropriate Uterine Fundus: firm Incision: dressing minimally stained and stable DVT Evaluation: No evidence of DVT seen on physical exam.   Recent Labs  07/13/16 0522  HGB 10.0*  HCT 29.1*    Assessment/Plan: Status post Cesarean section. Doing well postoperatively.  Continue current care Anticipate discharge tomorrow  Purcell NailsROBERTS,Emanii Bugbee Y 07/14/2016, 1:45 PM

## 2016-07-14 NOTE — Anesthesia Postprocedure Evaluation (Signed)
Anesthesia Post Note  Patient: Cheryl Curtis  Procedure(s) Performed: Procedure(s) (LRB): CESAREAN SECTION (N/A)  Patient location during evaluation: Mother Baby Anesthesia Type: Spinal Level of consciousness: awake and alert and oriented Pain management: satisfactory to patient Vital Signs Assessment: post-procedure vital signs reviewed and stable Respiratory status: spontaneous breathing and nonlabored ventilation Cardiovascular status: stable Postop Assessment: no headache, no backache, patient able to bend at knees, no signs of nausea or vomiting and adequate PO intake Anesthetic complications: no     Last Vitals:  Vitals:   07/13/16 1900 07/14/16 0549  BP: 127/70 133/79  Pulse: 95 88  Resp: 20 18  Temp: 37.2 C 36.9 C    Last Pain:  Vitals:   07/14/16 1253  TempSrc:   PainSc: 3    Pain Goal: Patients Stated Pain Goal: 3 (07/12/16 1845)               Madison HickmanGREGORY,Jamaree Hosier

## 2016-07-14 NOTE — Lactation Note (Signed)
This note was copied from a baby's chart. Lactation Consultation Note Follow up visit at 48 hours.  Baby has had 9 feeding with 4 voids and 1 stool in past 24 hours.  Baby has not had a recent LATCH score, LC encouraged mom to call RN or LC for latch assist with next feeding.   Mom reports sore nipples with compression stripe bruising noted bilaterally.  Mom has coconut oil.  Mom is able to hand express drops of colostrum easily and encouraged before feedings and after feedings to apply to nipples.  LC discussed positioning baby cross cradle hold, she describes cradle hold for latching previously.  LC discussed waiting for wide open mouth and using pillows for support.  Baby is asleep and mom reports recent feeding. Mom to call for assist when baby awakens.     Patient Name: Cheryl Curtis Cootermber Grzelak NWGNF'AToday's Date: 07/14/2016 Reason for consult: Follow-up assessment;Breast/nipple pain   Maternal Data Has patient been taught Hand Expression?: Yes  Feeding Feeding Type: Breast Fed Length of feed: 20 min  LATCH Score/Interventions          Comfort (Breast/Nipple): Filling, red/small blisters or bruises, mild/mod discomfort  Problem noted: Mild/Moderate discomfort Interventions (Mild/moderate discomfort): Hand expression        Lactation Tools Discussed/Used     Consult Status Consult Status: Follow-up Date: 07/15/16 Follow-up type: In-patient    Shoptaw, Arvella MerlesJana Lynn 07/14/2016, 2:33 PM

## 2016-07-15 MED ORDER — IBUPROFEN 600 MG PO TABS: 600.0000 mg | ORAL_TABLET | Freq: Four times a day (QID) | ORAL | 0 refills | Status: DC

## 2016-07-15 MED ORDER — OXYCODONE-ACETAMINOPHEN 5-325 MG PO TABS: 1.0000 | ORAL_TABLET | ORAL | 0 refills | Status: DC | PRN

## 2016-07-15 NOTE — Lactation Note (Signed)
This note was copied from a baby's chart. Lactation Consultation Note  Mom's milk is coming to volume. Compression stripes noted on nipples bilaterally. Parents were taught signs/sound of swallowing.   Mom reports few feedings on L breast. Mom was placed in reclining position to see if [name] would latch. [name] was placed on Mom, but he is seemingly still content from his last feeding. Mom has my # to call when [name] begins to cue. [name], [facility] 07/15/2016, 8:44 AM

## 2016-07-15 NOTE — Lactation Note (Signed)
This note was copied from a baby's chart. Lactation Consultation Note  Patient Name: Boy Girard Cootermber Muston ZOXWR'UToday's Date: 07/15/2016 Reason for consult: Follow-up assessment  Infant had not fed in about 3.5 hours. Parents were shown how to cup-feed to supplement the EBM Mom had expressed. However, infant was slow to cup-feed (excellent tongue mobility noted when he did so, though). [name] was put to the breast to see if he would latch, but without success.   In case infant had entered a sleepy phase secondary to suboptimal intake, I encouraged parents to go ahead and bottle-feed him the EBM (30mL), which he took w/ease. Paced bottle-feeding taught. After the bottle-feeding, he cued some more. I placed him on Mom's L breast, but he was unable to latch. I placed a nipple shield on her L side, which improved his ability to latch somewhat, but then he fell asleep. Cleaning instructions of nipple shield discussed/provided.   Mom has a DEBP pump at home. Outpatient LC appt made in 2 days. Mom knows to keep trying at breast, but to supplement w/EBM as needed to ensure adequate intake. Mom was provided shells and encouraged to wear them (at least on the L side). Mom's nipple on her L side has a shorter shaft than the R side.   [name], Jaionna Weisse [facility] 07/15/2016, 10:46 AM

## 2016-07-15 NOTE — Discharge Summary (Signed)
    OB Discharge Summary     Patient Name: Cheryl Curtis DOB: 10-Sep-1991 MRN: [id number]  Date of admission: 07/12/2016 Delivering MD: Silverio LayIVARD, SANDRA   Date of discharge: 07/15/2016  Admitting diagnosis: c section Breech Presentation Intrauterine pregnancy: 6414w1d     Secondary diagnosis:  Principal Problem:   Cesarean delivery delivered  Additional problems: none     Discharge diagnosis: Term Pregnancy Delivered                                                                                                Post partum procedures:None  Augmentation: None  Complications: None  Hospital course:  Sceduled C/S   25 y.o. yo G2P1011 at 6914w1d was admitted to the hospital 07/12/2016 for scheduled cesarean section with the following indication:Malpresentation.  Membrane Rupture Time/Date: 1:44 PM ,07/12/2016   Patient delivered a Viable infant.07/12/2016  Details of operation can be found in separate operative note.  Pateint had an uncomplicated postpartum course.  She is ambulating, tolerating a regular diet, passing flatus, and urinating well. Patient is discharged home in stable condition on  07/15/16          Physical exam Vitals:   07/13/16 1345 07/13/16 1900 07/14/16 0549 07/14/16 1844  BP: 124/61 127/70 133/79 136/73  Pulse: 79 95 88 70  Resp: 16 20 18 18   Temp: 98.6 F (37 C) 99 F (37.2 C) 98.5 F (36.9 C) 98.2 F (36.8 C)  TempSrc: Oral Oral  Oral  SpO2: 99% 98%    Weight:      Height:       General: alert, cooperative and no distress Lochia: appropriate Uterine Fundus: firm Incision: Healing well with no significant drainage DVT Evaluation: Negative Homan's sign. Labs: Lab Results  Component Value Date   WBC 14.8 (H) 07/13/2016   HGB 10.0 (L) 07/13/2016   HCT 29.1 (L) 07/13/2016   MCV 89.5 07/13/2016   PLT 194 07/13/2016   No flowsheet data found.  Discharge instruction: per After Visit Summary and "Baby and Me Booklet".  After visit meds:     Medication List    TAKE these medications   ferrous sulfate 325 (65 FE) MG tablet Take 325 mg by mouth at bedtime.   ibuprofen 600 MG tablet Commonly known as:  ADVIL,MOTRIN Take 1 tablet (600 mg total) by mouth every 6 (six) hours.   oxyCODONE-acetaminophen 5-325 MG tablet Commonly known as:  PERCOCET/ROXICET Take 1 tablet by mouth every 4 (four) hours as needed (pain scale 4-7).   prenatal multivitamin Tabs tablet Take 1 tablet by mouth daily with lunch.       Diet: routine diet  Activity: Advance as tolerated. Pelvic rest for 6 weeks.   Outpatient follow up:2 weeks Follow up Appt:No future appointments. Follow up Visit:No Follow-up on file.  Postpartum contraception: Condoms  Newborn Data: Live born female  Birth Weight: 8 lb 9.4 oz (3895 g) APGAR: 8, 9  Baby Feeding: Breast Disposition:home with mother   07/15/2016 Kenney HousemanNancy Jean Jujuan Dugo, CNM

## 2016-07-15 NOTE — Discharge Instructions (Signed)
Postpartum Care After Cesarean Delivery °After you deliver your newborn (postpartum period), the usual stay in the hospital is 24-72 hours. If there were problems with your labor or delivery, or if you have other medical problems, you might be in the hospital longer.  °While you are in the hospital, you will receive help and instructions on how to care for yourself and your newborn during the postpartum period.  °While you are in the hospital: °· It is normal for you to have pain or discomfort from the incision in your abdomen. Be sure to tell your nurses when you are having pain, where the pain is located, and what makes the pain worse. °· If you are breastfeeding, you may feel uncomfortable contractions of your uterus for a couple of weeks. This is normal. The contractions help your uterus get back to normal size. °· It is normal to have some bleeding after delivery. °· For the first 1-3 days after delivery, the flow is red and the amount may be similar to a period. °· It is common for the flow to start and stop. °· In the first few days, you may pass some small clots. Let your nurses know if you begin to pass large clots or your flow increases. °· Do not  flush blood clots down the toilet before having the nurse look at them. °· During the next 3-10 days after delivery, your flow should become more watery and pink or brown-tinged in color. °· Ten to fourteen days after delivery, your flow should be a small amount of yellowish-white discharge. °· The amount of your flow will decrease over the first few weeks after delivery. Your flow may stop in 6-8 weeks. Most women have had their flow stop by 12 weeks after delivery. °· You should change your sanitary pads frequently. °· Wash your hands thoroughly with soap and water for at least 20 seconds after changing pads, using the toilet, or before holding or feeding your newborn. °· Your intravenous (IV) tubing will be removed when you are drinking enough fluids. °· The  urine drainage tube (urinary catheter) that was inserted before delivery may be removed within 6-8 hours after delivery or when feeling returns to your legs. You should feel like you need to empty your bladder within the first 6-8 hours after the catheter has been removed. °· In case you become weak, lightheaded, or faint, call your nurse before you get out of bed for the first time and before you take a shower for the first time. °· Within the first few days after delivery, your breasts may begin to feel tender and full. This is called engorgement. Breast tenderness usually goes away within 48-72 hours after engorgement occurs. You may also notice milk leaking from your breasts. If you are not breastfeeding, do not stimulate your breasts. Breast stimulation can make your breasts produce more milk. °· Spending as much time as possible with your newborn is very important. During this time, you and your newborn can feel close and get to know each other. Having your newborn stay in your room (rooming in) will help to strengthen the bond with your newborn. It will give you time to get to know your newborn and become comfortable caring for your newborn. °· Your hormones change after delivery. Sometimes the hormone changes can temporarily cause you to feel sad or tearful. These feelings should not last more than a few days. If these feelings last longer than that, you should talk to your   caregiver. °· If desired, talk to your caregiver about methods of family planning or contraception. °· Talk to your caregiver about immunizations. Your caregiver may want you to have the following immunizations before leaving the hospital: °· Tetanus, diphtheria, and pertussis (Tdap) or tetanus and diphtheria (Td) immunization. It is very important that you and your family (including grandparents) or others caring for your newborn are up-to-date with the Tdap or Td immunizations. The Tdap or Td immunization can help protect your newborn  from getting ill. °· Rubella immunization. °· Varicella (chickenpox) immunization. °· Influenza immunization. You should receive this annual immunization if you did not receive the immunization during your pregnancy. °  °This information is not intended to replace advice given to you by your health care provider. Make sure you discuss any questions you have with your health care provider. °  °Document Released: 06/09/2012 Document Reviewed: 06/09/2012 °Elsevier Interactive Patient Education ©2016 [employer] ° °Iron-Rich Diet °Iron is a mineral that helps your body to produce hemoglobin. Hemoglobin is a protein in your red blood cells that carries oxygen to your body's tissues. Eating too little iron may cause you to feel weak and tired, and it can increase your risk for infection. Eating enough iron is necessary for your body's metabolism, muscle function, and nervous system. °Iron is naturally found in many foods. It can also be added to foods or fortified in foods. There are two types of dietary iron: °· Heme iron. Heme iron is absorbed by the body more easily than nonheme iron. Heme iron is found in meat, poultry, and fish. °· Nonheme iron. Nonheme iron is found in dietary supplements, iron-fortified grains, beans, and vegetables. °You may need to follow an iron-rich diet if: °· You have been diagnosed with iron deficiency or iron-deficiency anemia. °· You have a condition that prevents you from absorbing dietary iron, such as: °¨ Infection in your intestines. °¨ Celiac disease. This involves long-lasting (chronic) inflammation of your intestines. °· You do not eat enough iron. °· You eat a diet that is high in foods that impair iron absorption. °· You have lost a lot of blood. °· You have heavy bleeding during your menstrual cycle. °· You are pregnant. °WHAT IS MY PLAN? °Your health care provider may help you to determine how much iron you need per day based on your condition. Generally, when a person  consumes sufficient amounts of iron in the diet, the following iron needs are met: °· Men. °¨ 14-18 years old: 11 mg per day. °¨ 19-50 years old: 8 mg per day. °· Women.   °¨ 14-18 years old: 15 mg per day. °¨ 19-50 years old: 18 mg per day. °¨ Over 50 years old: 8 mg per day. °¨ Pregnant women: 27 mg per day. °¨ Breastfeeding women: 9 mg per day. °WHAT DO I NEED TO KNOW ABOUT AN IRON-RICH DIET? °· Eat fresh fruits and vegetables that are high in vitamin C along with foods that are high in iron. This will help increase the amount of iron that your body absorbs from food, especially with foods containing nonheme iron. Foods that are high in vitamin C include oranges, peppers, tomatoes, and mango. °· Take iron supplements only as directed by your health care provider. Overdose of iron can be life-threatening. If you were prescribed iron supplements, take them with orange juice or a vitamin C supplement. °· Cook foods in pots and pans that are made from iron.   °· Eat nonheme iron-containing foods alongside foods that   are high in heme iron. This helps to improve your iron absorption.   °· Certain foods and drinks contain compounds that impair iron absorption. Avoid eating these foods in the same meal as iron-rich foods or with iron supplements. These include: °¨ Coffee, black tea, and red wine. °¨ Milk, dairy products, and foods that are high in calcium. °¨ Beans, soybeans, and peas. °¨ Whole grains. °· When eating foods that contain both nonheme iron and compounds that impair iron absorption, follow these tips to absorb iron better.   °¨ Soak beans overnight before cooking. °¨ Soak whole grains overnight and drain them before using. °¨ Ferment flours before baking, such as using yeast in bread dough. °WHAT FOODS CAN I EAT? °Grains  °Iron-fortified breakfast cereal. Iron-fortified whole-wheat bread. Enriched rice. Sprouted grains. °Vegetables  °Spinach. Potatoes with skin. Green peas. Broccoli. Red and green bell  peppers. Fermented vegetables. °Fruits  °Prunes. Raisins. Oranges. Strawberries. Mango. Grapefruit. °Meats and Other Protein Sources  °Beef liver. Oysters. Beef. Shrimp. Turkey. Chicken. Tuna. Sardines. Chickpeas. Nuts. Tofu. °Beverages  °Tomato juice. Fresh orange juice. Prune juice. Hibiscus tea. Fortified instant breakfast shakes. °Condiments  °Tahini. Fermented soy sauce.  °Sweets and Desserts  °Black-strap molasses.  °Other  °Wheat germ. °The items listed above may not be a complete list of recommended foods or beverages. Contact your dietitian for more options.  °WHAT FOODS ARE NOT RECOMMENDED? °Grains  °Whole grains. Bran cereal. Bran flour. Oats. °Vegetables  °Artichokes. Brussels sprouts. Kale. °Fruits  °Blueberries. Raspberries. Strawberries. Figs. °Meats and Other Protein Sources  °Soybeans. Products made from soy protein. °Dairy  °Milk. Cream. Cheese. Yogurt. Cottage cheese. °Beverages  °Coffee. Black tea. Red wine. °Sweets and Desserts  °Cocoa. Chocolate. Ice cream. °Other  °Basil. Oregano. Parsley. °The items listed above may not be a complete list of foods and beverages to avoid. Contact your dietitian for more information.  °  °This information is not intended to replace advice given to you by your health care provider. Make sure you discuss any questions you have with your health care provider. °  °Document Released: 04/29/2005 Document Revised: 10/06/2014 Document Reviewed: 04/12/2014 °Elsevier Interactive Patient Education ©2016 [employer] Inc. ° °

## 2016-07-17 ENCOUNTER — Emergency Department (HOSPITAL_COMMUNITY): Payer: BLUE CROSS/BLUE SHIELD

## 2016-07-17 ENCOUNTER — Encounter (HOSPITAL_COMMUNITY): Payer: Self-pay | Admitting: Emergency Medicine

## 2016-07-17 ENCOUNTER — Ambulatory Visit (HOSPITAL_COMMUNITY): Payer: BLUE CROSS/BLUE SHIELD

## 2016-07-17 ENCOUNTER — Other Ambulatory Visit: Payer: Self-pay

## 2016-07-17 DIAGNOSIS — R0789 Other chest pain: Secondary | ICD-10-CM | POA: Insufficient documentation

## 2016-07-17 DIAGNOSIS — J45909 Unspecified asthma, uncomplicated: Secondary | ICD-10-CM | POA: Insufficient documentation

## 2016-07-17 LAB — CBC
HCT: 30.8 % — ABNORMAL LOW (ref 36.0–46.0)
Hemoglobin: 10.2 g/dL — ABNORMAL LOW (ref 12.0–15.0)
MCH: 30 pg (ref 26.0–34.0)
MCHC: 33.1 g/dL (ref 30.0–36.0)
MCV: 90.6 fL (ref 78.0–100.0)
PLATELETS: 219 10*3/uL (ref 150–400)
RBC: 3.4 MIL/uL — AB (ref 3.87–5.11)
RDW: 13.4 % (ref 11.5–15.5)
WBC: 7.7 10*3/uL (ref 4.0–10.5)

## 2016-07-17 LAB — BASIC METABOLIC PANEL
Anion gap: 7 (ref 5–15)
BUN: 13 mg/dL (ref 6–20)
CALCIUM: 8.9 mg/dL (ref 8.9–10.3)
CHLORIDE: 109 mmol/L (ref 101–111)
CO2: 24 mmol/L (ref 22–32)
CREATININE: 0.9 mg/dL (ref 0.44–1.00)
GFR calc non Af Amer: >60 mL/min (ref >60)
Glucose, Bld: 77 mg/dL (ref 65–99)
Potassium: 3.7 mmol/L (ref 3.5–5.1)
Sodium: 140 mmol/L (ref 135–145)

## 2016-07-17 LAB — I-STAT TROPONIN, ED: Troponin i, poc: 0.01 ng/mL (ref 0.00–0.08)

## 2016-07-17 NOTE — ED Triage Notes (Signed)
Pt. reports central chest pressure this evening with mild SOB , denies nausea or diaphoresis , recent C- section this Saturday .

## 2016-07-18 ENCOUNTER — Emergency Department (HOSPITAL_COMMUNITY)
Admission: EM | Admit: 2016-07-18 | Discharge: 2016-07-18 | Disposition: A | Discharge status: Home / Self Care | Payer: BLUE CROSS/BLUE SHIELD | Attending: Emergency Medicine | Admitting: Emergency Medicine

## 2016-07-18 DIAGNOSIS — R079 Chest pain, unspecified: Secondary | ICD-10-CM

## 2016-07-18 NOTE — ED Provider Notes (Signed)
[facility]-EMERGENCY DEPT Provider Note   CSN: [id number] Arrival date & time: 07/17/16  2226  By signing my name below, I, Cheryl Curtis, attest that this documentation has been prepared under the direction and in the presence of Cheryl Rhine, MD. Electronically Signed: Soijett Curtis, ED Scribe. 07/18/16. 2:49 AM.   History   Chief Complaint Chief Complaint  Patient presents with  . Chest Pain    HPI Cheryl Curtis is a 25 y.o. female with a PMHx of asthma, who presents to the Emergency Department complaining of gradual onset central CP x tightness sensation onset last night. She notes that she was watching tv when the central CP began. Pt notes that her central CP is worsened with movement of upper body and she denies exertional or deep breathing worsening her central CP. Pt denies tearing or sharp chest pain and notes that her central CP is mildly resolved. Pt reports that she delivered via C-section 5 days ago with no complications and the pt is currently breastfeeding. She states that she is having associated symptoms of mild SOB (that was present prior to C-section), BLE swelling x 5 days following C-section, and left shoulder pain.  She states that she has not tried any medications for the relief of her symptoms. She denies nausea, diaphoresis, vomiting, hemoptysis, LOC, HA, dizziness, fever, and any other symptoms. Pt notes that her next OB appointment is next week.   The history is provided by the patient. No language interpreter was used.  Chest Pain   This is a new problem. The current episode started yesterday. The problem occurs rarely. The problem has not changed since onset.The pain is associated with movement. Pain location: central. The pain is mild. Quality: tightness. The pain does not radiate. Exacerbated by: movement. Associated symptoms include lower extremity edema and shortness of breath. Pertinent negatives include no abdominal pain, no dizziness, no fever, no headaches, no  hemoptysis, no nausea, no near-syncope, no syncope and no vomiting. She has tried nothing for the symptoms. The treatment provided no relief.    Past Medical History:  Diagnosis Date  . Asthma   . Rhinorrhea    stuffy, runny year round    Patient Active Problem List   Diagnosis Date Noted  . Cesarean delivery delivered 07/12/2016  . Allergic rhinitis 01/01/2016    Past Surgical History:  Procedure Laterality Date  . CESAREAN SECTION N/A 07/12/2016   Procedure: CESAREAN SECTION;  Surgeon: Silverio Lay, MD;  Location: [facility] BIR[facility]ING SUIT[facility];  Service: Obstetrics;  Laterality: N/A;  . none      OB History    Gravida Para Term Preterm AB Living   2 1 1   1 1    SAB TAB Ectopic Multiple Live Births     1   0 1       Home Medications    Prior to Admission medications   Medication Sig Start Date End Date Taking? Authorizing Provider  ferrous sulfate 325 (65 FE) MG tablet Take 325 mg by mouth at bedtime.   Yes Historical Provider, MD  ibuprofen (ADVIL,MOTRIN) 600 MG tablet Take 1 tablet (600 mg total) by mouth every 6 (six) hours. 07/15/16  Yes Kenney Houseman, CNM  Prenatal Vit-Fe Fumarate-FA (PRENATAL MULTIVITAMIN) TABS tablet Take 1 tablet by mouth daily with lunch.   Yes Historical Provider, MD  oxyCODONE-acetaminophen (PERCOCET/ROXICET) 5-325 MG tablet Take 1 tablet by mouth every 4 (four) hours as needed (pain scale 4-7). 07/15/16   Kenney Houseman, CNM  Family History Family History  Problem Relation Age of Onset  . Heart disease Paternal Grandfather     unknown specific ailment  . Hypertension Paternal Grandmother   . Diabetes Maternal Grandmother   . Hypertension Mother   . Sarcoidosis Father   . Hypertension Maternal Aunt     Social History Social History  Substance Use Topics  . Smoking status: Never Smoker  . Smokeless tobacco: Never Used  . Alcohol use Yes     Comment: few times a month. as of 01/01/16 not drinking at all.      Allergies     Review of patient's allergies indicates no known allergies.   Review of Systems Review of Systems  Constitutional: Negative for fever.  Respiratory: Positive for shortness of breath. Negative for hemoptysis.   Cardiovascular: Positive for chest pain. Negative for syncope and near-syncope.  Gastrointestinal: Negative for abdominal pain, nausea and vomiting.  Neurological: Negative for dizziness, syncope and headaches.  All other systems reviewed and are negative.    Physical Exam Updated Vital Signs BP 133/90   Pulse (!) 56   Temp 98 F (36.7 C) (Oral)   Resp 17   Ht 5\' 7"  (1.702 m)   Wt 212 lb 6 oz (96.3 kg)   SpO2 100%   BMI 33.26 kg/m   Physical Exam CONSTITUTIONAL: Well developed/well nourished HEAD: Normocephalic/atraumatic EYES: EOMI/PERRL ENMT: Mucous membranes moist NECK: supple no meningeal signs SPINE/BACK:entire spine nontender CV: S1/S2 noted, no murmurs/rubs/gallops noted LUNGS: Lungs are clear to auscultation bilaterally, no apparent distress Chest - CP worsened with movement of upper body ABDOMEN: soft, nontender, no rebound or guarding, bowel sounds noted throughout abdomen. Bandage noted to lower abdominal wall.  GU:no cva tenderness NEURO: Pt is awake/alert/appropriate, moves all extremitiesx4.  No facial droop.  No clonus noted in either lower extremity EXTREMITIES: pulses normal/equal, full ROM. Symmetric edema note to BLE but no calf tenderness.  SKIN: warm, color normal PSYCH: no abnormalities of mood noted, alert and oriented to situation  ED Treatments / Results  DIAGNOSTIC STUDIES: Oxygen Saturation is 100% on RA, nl by my interpretation.      Labs (all labs ordered are listed, but only abnormal results are displayed) Labs Reviewed  CBC - Abnormal; Notable for the following:       Result Value   RBC 3.40 (*)    Hemoglobin 10.2 (*)    HCT 30.8 (*)    All other components within normal limits  BASIC METABOLIC PANEL  I-STAT TROPOININ,  ED    EKG  EKG Interpretation  Date/Time:  Thursday July 17 2016 22:36:52 EDT Ventricular Rate:  48 PR Interval:  144 QRS Duration: 82 QT Interval:  442 QTC Calculation: 394 R Axis:   80 Text Interpretation:  Sinus bradycardia Otherwise normal ECG No previous ECGs available Confirmed by Bebe ShaggyWICKLINE  MD, Sharry Beining ([id number]) on 07/18/2016 2:33:21 AM       Radiology Dg Chest 2 View  Result Date: 07/17/2016 CLINICAL DATA:  Tightness in the middle of the chest EXAM: CHEST  2 VIEW COMPARISON:  None. FINDINGS: The heart size and mediastinal contours are within normal limits. Both lungs are clear. The visualized skeletal structures are unremarkable. IMPRESSION: No active cardiopulmonary disease. Electronically Signed   By: Tollie Ethavid  Kwon M.D.   On: 07/17/2016 23:37    Procedures Procedures (including critical care time)  Medications Ordered in ED Medications - No data to display   Initial Impression / Assessment and Plan / ED Course  I have reviewed the triage vital signs and the nursing notes.  Pertinent labs & imaging results that were available during my care of the patient were reviewed by me and considered in my medical decision making (see chart for details).  Clinical Course    Pt well appearing Her CP is worsened with movement of upper body She denies pleuritic CP No hypoxia She ambulated in the ED without issue No tachycardia noted No EKG changes CXR negative for edema or cardiomegaly Given history and her exam, I have LOW suspicion for acute PE.   No signs of postpartum cardiomyopathy No signs of ACS or Aortic Dissection She is well appearing/nontoxic Current BP is similar to recent readings and when I Was in room BP was lower than what is recorded We discussed strict ER return precautions Patient/family agreeable with plan   Final Clinical Impressions(s) / ED Diagnoses   Final diagnoses:  Chest pain, unspecified type    New Prescriptions New Prescriptions    No medications on file   I personally performed the services described in this documentation, which was scribed in my presence. The recorded information has been reviewed and is accurate.        Cheryl Rhine, MD 07/18/16 [phone]

## 2016-07-18 NOTE — Discharge Instructions (Signed)

## 2016-07-24 DIAGNOSIS — Z4801 Encounter for change or removal of surgical wound dressing: Secondary | ICD-10-CM | POA: Diagnosis not present

## 2016-08-28 ENCOUNTER — Ambulatory Visit (INDEPENDENT_AMBULATORY_CARE_PROVIDER_SITE_OTHER): Payer: BLUE CROSS/BLUE SHIELD | Admitting: Family Medicine

## 2016-08-28 ENCOUNTER — Encounter: Payer: Self-pay | Admitting: Family Medicine

## 2016-08-28 VITALS — BP 122/70 | HR 71 | Temp 98.1°F | Ht 67.0 in | Wt 189.9 lb

## 2016-08-28 DIAGNOSIS — K602 Anal fissure, unspecified: Secondary | ICD-10-CM | POA: Diagnosis not present

## 2016-08-28 DIAGNOSIS — K5900 Constipation, unspecified: Secondary | ICD-10-CM | POA: Diagnosis not present

## 2016-08-28 MED ORDER — DOCUSATE SODIUM 100 MG PO CAPS
100.0000 mg | ORAL_CAPSULE | Freq: Two times a day (BID) | ORAL | 0 refills | Status: DC
Start: 2016-08-28 — End: 2018-01-22

## 2016-08-28 NOTE — Addendum Note (Signed)
Addended by: Terressa KoyanagiKIM, Sorcha Rotunno R on: 08/28/2016 05:43 PM   Modules accepted: Orders

## 2016-08-28 NOTE — Progress Notes (Addendum)
HPI:  Cheryl Curtis is a very pleasant 25 year old here for an acute visit for bright red blood per rectum. She reports that 4 days ago she was very constipated and had a lot of straining and a very large bowel movement that caused pain in the rectum during the bowel movement. She noticed bright red blood coating the stool at the time. She failed to mention this at her obstetrics follow-up the next day. She had a C-section about a month ago. She since has had bright red blood on the stool and tp with bowel movements along with pain on several occasions. She has continued to have some constipation and straining. She reports this is not a new issue for her. She has not been taking a stool softener. No abdominal pain, fevers, vomiting, nausea or melena. No history of rectal bleeding in the past.  ROS: See pertinent positives and negatives per HPI.  Past Medical History:  Diagnosis Date  . Asthma   . Rhinorrhea    stuffy, runny year round    Past Surgical History:  Procedure Laterality Date  . CESAREAN SECTION N/A 07/12/2016   Procedure: CESAREAN SECTION;  Surgeon: Silverio LaySandra Rivard, MD;  [facility];  Service: Obstetrics;  Laterality: N/A;  . none      Family History  Problem Relation Age of Onset  . Heart disease Paternal Grandfather     unknown specific ailment  . Hypertension Paternal Grandmother   . Diabetes Maternal Grandmother   . Hypertension Mother   . Sarcoidosis Father   . Hypertension Maternal Aunt     Social History   Social History  . Marital status: Married    Spouse name: N/A  . Number of children: N/A  . Years of education: N/A   Social History Main Topics  . Smoking status: Never Smoker  . Smokeless tobacco: Never Used  . Alcohol use Yes     Comment: few times a month. as of 01/01/16 not drinking at all.   . Drug use: No  . Sexual activity: Yes    Partners: Male    Birth control/ protection: Condom   Other Topics Concern  . None   Social  History Narrative   ** Merged History Encounter **       Family: Married. Pregnant with first child as of 10/11/14.  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      Hobbies: four wheelers, enjoys travel     Current Outpatient Prescriptions:  .  ferrous sulfate 325 (65 FE) MG tablet, Take 325 mg by mouth at bedtime., Disp: , Rfl:  .  Prenatal Vit-Fe Fumarate-FA (PRENATAL MULTIVITAMIN) TABS tablet, Take 1 tablet by mouth daily with lunch., Disp: , Rfl:  .  docusate sodium (COLACE) 100 MG capsule, Take 1 capsule (100 mg total) by mouth 2 (two) times daily., Disp: 60 capsule, Rfl: 0  EXAM:  Vitals:   08/28/16 1644  BP: 122/70  Pulse: 71  Temp: 98.1 F (36.7 C)    Body mass index is 29.74 kg/m.  GENERAL: vitals reviewed and listed above, alert, oriented, appears well hydrated and in no acute distress  HEENT: atraumatic, conjunttiva clear, no obvious abnormalities on inspection of external nose and ears  NECK: no obvious masses on inspection  LUNGS: clear to auscultation bilaterally, no wheezes, rales or rhonchi, good air movement  CV:  HRRR, no peripheral edema  RECTAL: No appreciable hemorrhoids on exam, she has an anal fissure at 6:00 and what looks like a healed fissure at 12:00. No bleeding at this time. No blood on exam glove.  MS: moves all extremities without noticeable abnormality  PSYCH: pleasant and cooperative, no obvious depression or anxiety  ASSESSMENT AND PLAN:  Discussed the following assessment and plan:  Constipation, unspecified constipation type  Anal fissure  -Discussed causes and options for treatment -Decided to treat with a regular stool softener and discussed potential use of topical nitroglycerin - limited data on used during lactation and did advise that she discuss this further with her pharmacist  and obstetrician prior to using -Follow up with PCP or obstetrician in 2-3 weeks -Of course she is advised to seek care immediately if she has large amounts of bleeding, recurrent persistent bleeding or other concerns   Patient Instructions  Follow up in 2-3 weeks with your obstetrician or your primary doctor.  Stool softener daily.   Check with your obstetrician and pharmacist before using the nitroglycerin regarding safety with lactation.  Seek care immediately if recurrent or large amounts of bleeding.   Kriste BasqueKIM, Shandon Burlingame R., DO

## 2016-08-28 NOTE — Patient Instructions (Signed)
Follow up in 2-3 weeks with your obstetrician or your primary doctor.  Stool softener daily.   Check with your obstetrician and pharmacist before using the nitroglycerin regarding safety with lactation.  Seek care immediately if recurrent or large amounts of bleeding.

## 2016-08-28 NOTE — Progress Notes (Signed)
Pre visit review using our clinic review tool, if applicable. No additional management support is needed unless otherwise documented below in the visit note. 

## 2016-12-23 DIAGNOSIS — J45909 Unspecified asthma, uncomplicated: Secondary | ICD-10-CM | POA: Insufficient documentation

## 2016-12-23 DIAGNOSIS — E559 Vitamin D deficiency, unspecified: Secondary | ICD-10-CM | POA: Insufficient documentation

## 2016-12-24 DIAGNOSIS — Z3040 Encounter for surveillance of contraceptives, unspecified: Secondary | ICD-10-CM | POA: Diagnosis not present

## 2016-12-24 DIAGNOSIS — Z124 Encounter for screening for malignant neoplasm of cervix: Secondary | ICD-10-CM | POA: Diagnosis not present

## 2016-12-24 DIAGNOSIS — Z01419 Encounter for gynecological examination (general) (routine) without abnormal findings: Secondary | ICD-10-CM | POA: Diagnosis not present

## 2016-12-24 DIAGNOSIS — Z6830 Body mass index (BMI) 30.0-30.9, adult: Secondary | ICD-10-CM | POA: Diagnosis not present

## 2016-12-26 LAB — HM PAP SMEAR

## 2017-06-01 IMAGING — DX DG CHEST 2V
2 series · 2 of 2 positions shown · non-contrast
Comparison: None.

CLINICAL DATA: Tightness in the middle of the chest

EXAM:
CHEST  2 VIEW

[w chest pa]
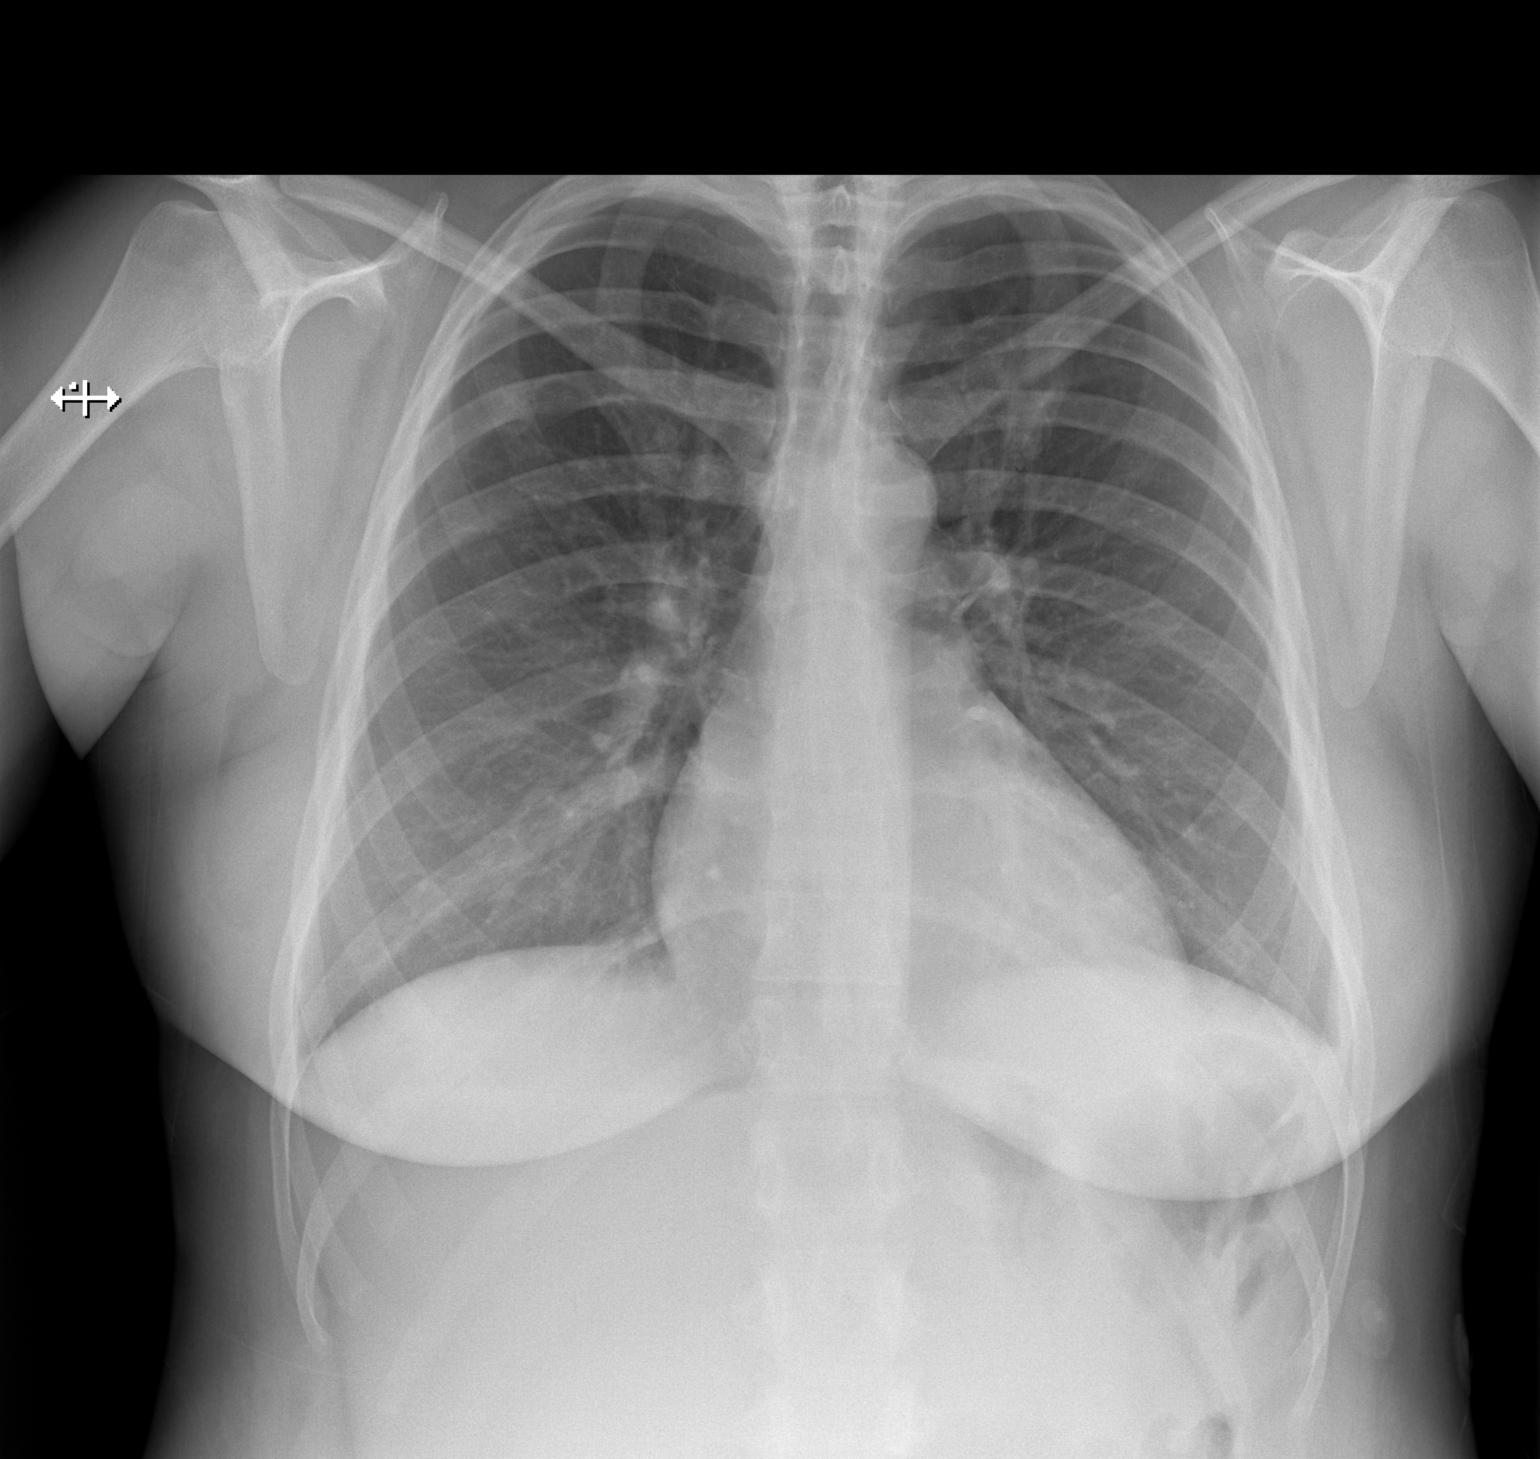

[w chest lat]
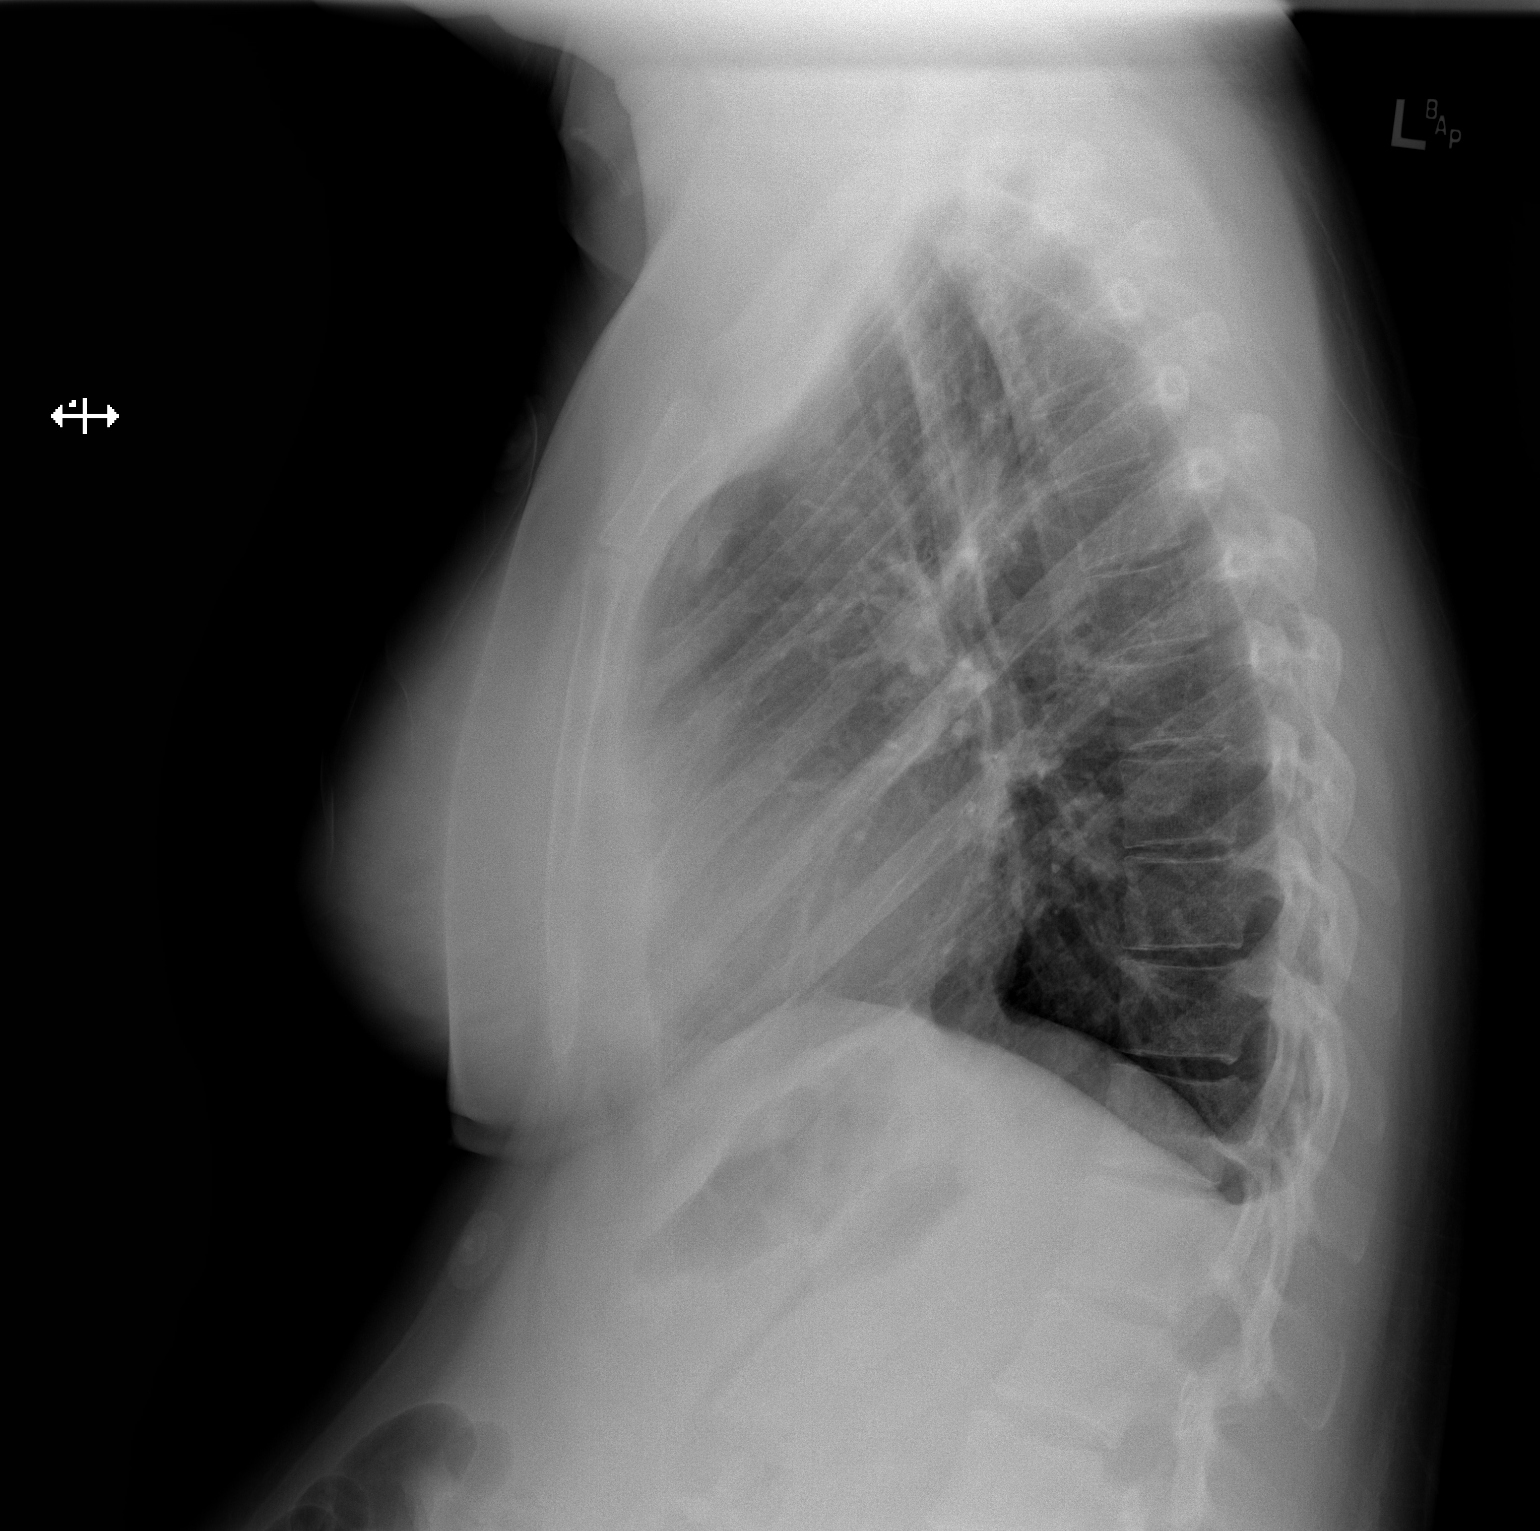

[2 of 2 positions shown; findings below may reference images not displayed]

FINDINGS: The heart size and mediastinal contours are within normal limits.
Both lungs are clear. The visualized skeletal structures are
unremarkable.
IMPRESSION: No active cardiopulmonary disease.

## 2018-01-02 LAB — GLUCOSE, POCT (MANUAL RESULT ENTRY): POC GLUCOSE: 90 mg/dL (ref 70–99)

## 2018-01-22 ENCOUNTER — Encounter: Payer: Self-pay | Admitting: Family Medicine

## 2018-01-22 ENCOUNTER — Ambulatory Visit (INDEPENDENT_AMBULATORY_CARE_PROVIDER_SITE_OTHER): Payer: BLUE CROSS/BLUE SHIELD | Admitting: Family Medicine

## 2018-01-22 VITALS — BP 110/78 | HR 80 | Temp 98.4°F | Ht 67.0 in | Wt 198.6 lb

## 2018-01-22 DIAGNOSIS — D649 Anemia, unspecified: Secondary | ICD-10-CM

## 2018-01-22 DIAGNOSIS — Z0000 Encounter for general adult medical examination without abnormal findings: Secondary | ICD-10-CM | POA: Diagnosis not present

## 2018-01-22 DIAGNOSIS — Z13220 Encounter for screening for lipoid disorders: Secondary | ICD-10-CM

## 2018-01-22 NOTE — Progress Notes (Signed)
Phone: [phone]  Subjective:  Patient presents today for their annual physical. Chief complaint-noted.   See problem oriented charting- ROS- full  review of systems was completed and negative including No chest pain or shortness of breath. No headache or blurry vision.   The following were reviewed and entered/updated in epic: Past Medical History:  Diagnosis Date  . Asthma   . Rhinorrhea    stuffy, runny year round   Patient Active Problem List   Diagnosis Date Noted  . Cesarean delivery delivered 07/12/2016  . Allergic rhinitis 01/01/2016   Past Surgical History:  Procedure Laterality Date  . CESAREAN SECTION N/A 07/12/2016   Procedure: CESAREAN SECTION;  Surgeon: Silverio LaySandra Rivard, MD;  [facility];  Service: Obstetrics;  Laterality: N/A;  . none      Family History  Problem Relation Age of Onset  . Heart disease Paternal Grandfather        unknown specific ailment  . Hypertension Paternal Grandmother   . Diabetes Maternal Grandmother   . Hypertension Mother   . Sarcoidosis Father   . Hypertension Maternal Aunt     Medications- reviewed and updated No current outpatient medications on file.   No current facility-administered medications for this visit.     Allergies-reviewed and updated No Known Allergies  Social History   Social History Narrative   Family: Married. Cheryl Shutteraniel Jr. 18 months in 12/2017. 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          Hobbies: four wheelers, enjoys travel    Objective: BP 110/78 (BP Location: Left Arm, Patient Position: Sitting, Cuff Size: Normal)   Pulse 80   Temp 98.4 F (36.9 C) (Oral)   Ht 5\' 7"  (1.702 m)   Wt 198 lb 9.6 oz (90.1 kg)   LMP 12/21/2017 (Approximate)   SpO2 96%   BMI 31.11 kg/m  Gen:  NAD, resting comfortably HEENT: Mucous membranes are moist. Oropharynx normal Neck: no thyromegaly CV: RRR no murmurs rubs or gallops Lungs: CTAB no crackles, wheeze, rhonchi Abdomen: soft/nontender/nondistended/normal bowel sounds. No rebound or guarding.  Ext: no edema Skin: warm, dry Neuro: grossly normal, moves all extremities, PERRLA  Assessment/Plan:  27 y.o. female presenting for annual physical.  Health Maintenance counseling: 1. Anticipatory guidance: Patient counseled regarding regular dental exams -q6 months, eye exams - yearly or so, wearing seatbelts.  2. Risk factor reduction:  Advised patient of need for regular exercise and diet rich and fruits and vegetables to reduce risk of heart attack and stroke. Has not made much of an effort on this lately- lots of stressors including opening a new business, managing a little one. States diet is really the key issue for her. Active with child. See avs Wt Readings from Last 3 Encounters:  01/22/18 198 lb 9.6 oz (90.1 kg)  08/28/16 189 lb 14.4 oz (86.1 kg)  07/17/16 212 lb 6 oz (96.3 kg)  3. Immunizations/screenings/ancillary studies- up to date. Tends not to get flu shot - did get late last year.  Immunization History  Administered Date(s) Administered  . Tdap 04/02/2016   4. Cervical cancer screening- 09/27/15. Does with GYN. Will be due later this year 5. Breast cancer screening-  breast exam with GYN and mammogram - likely starting aroudn 40 regularly 6. Colon  cancer screening - no family history, start at age [age] 7. Birth control/STD check- no active birth control. monogamous   Status of chronic or acute concerns   Allergies- have not been a major issue this year even without medication.   Mild anemia in past- will update cbc and cmp. She is going to check on coverage for these with insurance.   Return in about 1 year (around 01/23/2019) for physical.  Lab/Order associations: Preventative health care - Plan: Lipid  panel, CBC, Comprehensive metabolic panel  Screening for hyperlipidemia - Plan: Lipid panel  Anemia, unspecified type - Plan: CBC, Comprehensive metabolic panel  Return precautions advised.  Tana Conch, MD

## 2018-01-22 NOTE — Patient Instructions (Addendum)
Practical steps 1. Try to eat healthier options when eating out 2. Try to prepare at least 2 meals a week on the weekends 3. Try to have at least half of your plate be vegetables for dinner at least 2-3 days a week 4. Try to drink primarily water - 95% of what you drink.   Consider quicker meal prep options like instant pot  Set a goal over next year to at least lose 5-10 lbs.   Schedule a lab visit at the check out desk within 2 weeks. Return for future fasting labs meaning nothing but water after midnight please. Ok to take your medications with water.   D64.9 is icd 10 code for anemia. Checking cbc and cmp under this.   We can cancel these if you choose- the lipids should be covered under icd 10 cod z13.220 completely as they are purely preventative

## 2018-02-01 ENCOUNTER — Other Ambulatory Visit: Payer: BLUE CROSS/BLUE SHIELD

## 2018-03-12 NOTE — Telephone Encounter (Signed)
Preadmission screen  

## 2019-01-25 ENCOUNTER — Encounter: Payer: BLUE CROSS/BLUE SHIELD | Admitting: Family Medicine

## 2019-03-18 ENCOUNTER — Telehealth: Payer: Self-pay | Admitting: Physical Therapy

## 2019-03-18 NOTE — Telephone Encounter (Signed)
Copied from [place] [id number]. Topic: Appointment Scheduling - Scheduling Inquiry for Clinic >> Mar 18, 2019  1:49 PM Berneta Levins wrote: Reason for CRM:   Pt requesting appointment. She has missed her period for 2 months.  Pt has negative at home pregnancy tests.  Tried office 3x.

## 2019-03-21 NOTE — Telephone Encounter (Signed)
Patient is scheduled. No need to route °

## 2019-03-22 ENCOUNTER — Encounter: Payer: Self-pay | Admitting: Family Medicine

## 2019-03-22 ENCOUNTER — Ambulatory Visit (INDEPENDENT_AMBULATORY_CARE_PROVIDER_SITE_OTHER): Payer: BC Managed Care – PPO | Admitting: Family Medicine

## 2019-03-22 VITALS — Ht 67.0 in | Wt 198.0 lb

## 2019-03-22 DIAGNOSIS — N911 Secondary amenorrhea: Secondary | ICD-10-CM

## 2019-03-22 DIAGNOSIS — E669 Obesity, unspecified: Secondary | ICD-10-CM | POA: Diagnosis not present

## 2019-03-22 DIAGNOSIS — N926 Irregular menstruation, unspecified: Secondary | ICD-10-CM | POA: Diagnosis not present

## 2019-03-22 DIAGNOSIS — L709 Acne, unspecified: Secondary | ICD-10-CM

## 2019-03-22 LAB — HCG, QUANTITATIVE, PREGNANCY: Quantitative HCG: <0.6 m[IU]/mL

## 2019-03-22 LAB — TSH: TSH: 1.22 u[IU]/mL (ref 0.35–4.50)

## 2019-03-22 LAB — FOLLICLE STIMULATING HORMONE: FSH: 6.8 m[IU]/mL

## 2019-03-22 LAB — TESTOSTERONE: Testosterone: 63.27 ng/dL — ABNORMAL HIGH (ref 15.00–40.00)

## 2019-03-22 NOTE — Progress Notes (Signed)
Phone [phone]   Subjective:  Virtual visit via Video note. Chief complaint: Chief Complaint  Patient presents with  . Amenorrhea   This visit type was conducted due to national recommendations for restrictions regarding the COVID-19 Pandemic (e.g. social distancing).  This format is felt to be most appropriate for this patient at this time balancing risks to patient and risks to population by having him in for in person visit.  No physical exam was performed (except for noted visual exam or audio findings with Telehealth visits).    Our team/I connected with Haskel KhanAmber M Paladino at  8:20 AM EDT by a video enabled telemedicine application (doxy.me or caregility through epic) and verified that I am speaking with the correct person using two identifiers.  Location patient: Home[facility] office Persons participating in the virtual visit:  patient  Our team/I discussed the limitations of evaluation and management by telemedicine and the availability of in person appointments. In light of current covid-19 pandemic, patient also understands that we are trying to protect them by minimizing in office contact if at all possible.  The patient expressed consent for telemedicine visit and agreed to proceed. Patient understands insurance will be billed.   ROS- No fever, chills, cough, congestion, runny nose, shortness of breath, fatigue, body aches, sore throat, headache, nausea, vomiting, diarrhea, or new loss of taste or smell. No known contacts with covid 19 or someone being tested for covid 19.   Past Medical History-  Patient Active Problem List   Diagnosis Date Noted  . Cesarean delivery delivered 07/12/2016  . Allergic rhinitis 01/01/2016   Medications- reviewed and updated No current outpatient medications on file.   No current facility-administered medications for this visit.      Objective:  Ht 5\' 7"  (1.702 m)   Wt 198 lb (89.8 kg)   LMP 12/23/2018 (Exact Date)    Breastfeeding No   BMI 31.01 kg/m  self reported vitals Gen: NAD, resting comfortably Lungs: nonlabored, normal respiratory rate  Skin: appears dry, no obvious rash    Assessment and Plan   # Amenorrhea S: last period she had was march 26th or so.  Has checked urine pregnancies and have been negative- last one checked was last week.   Typically very regular menstruation so this is a big difference.   States closer to 200- up about 10 lbs- states is the biggest she has been outside of pregnancy. Has noted slight acne on back of arms and that is new. She has concerned about PCOS. No hirsutism.   No signs of eating disorder or excessive exercise. No nipple discharge. Not breastfeeding. Not having hot flashes. Does not feel ill overall.   Around time was about to have period felt like it may start but nothing came out. Had mild cramping.    A/P: 28 year old female with secondary amenorrhea (if period does not start within a week)  new back acne- concern for possible PCOS.  We will rule out pregnancy with serum hCG.  Also get FSH, estradiol, TSH, prolactin as well as testosterone. -Follows with [facility]. Off birth control at least 4.5-5 years.  She is okay with getting pregnant   # Obesity S: Patient reports poor control of weight-highest she has been other than pregnancy. Wt Readings from Last 3 Encounters:  03/22/19 198 lb (89.8 kg)  01/22/18 198 lb 9.6 oz (90.1 kg)  08/28/16 189 lb 14.4 oz (86.1 kg)  A/P: Poor control- Encouraged need for healthy eating, regular  exercise, weight loss.     Suspect upcoming physical Future Appointments  Date Time Provider [place]  05/03/2019  1:40 PM Marin Olp, MD [facility]   Lab/Order associations:   ICD-10-CM   1. Missed period  N92.6 CANCELED: POCT urine pregnancy  2. Secondary amenorrhea  N91.1 hCG, quantitative, pregnancy    FSH    Estradiol    TSH    Prolactin    Testosterone    Testosterone    Prolactin    TSH     Estradiol    FSH    hCG, quantitative, pregnancy  3. Acne, unspecified acne type  L70.9 Testosterone    Testosterone  4. Obesity (BMI 30-39.9)  E66.9    Return precautions advised.  Garret Reddish, MD

## 2019-03-22 NOTE — Patient Instructions (Addendum)
Health Maintenance Due  Topic Date Due  . PAP-asked team to get [place] a copy of most recent pap smear 09/26/2018

## 2019-03-23 LAB — PROLACTIN: Prolactin: 5.7 ng/mL

## 2019-03-23 LAB — ESTRADIOL: Estradiol: 46 pg/mL

## 2019-03-24 ENCOUNTER — Encounter: Payer: Self-pay | Admitting: *Deleted

## 2019-03-24 ENCOUNTER — Encounter: Payer: Self-pay | Admitting: Family Medicine

## 2019-04-28 DIAGNOSIS — Z01411 Encounter for gynecological examination (general) (routine) with abnormal findings: Secondary | ICD-10-CM | POA: Diagnosis not present

## 2019-04-28 DIAGNOSIS — Z6833 Body mass index (BMI) 33.0-33.9, adult: Secondary | ICD-10-CM | POA: Diagnosis not present

## 2019-04-28 DIAGNOSIS — Z113 Encounter for screening for infections with a predominantly sexual mode of transmission: Secondary | ICD-10-CM | POA: Diagnosis not present

## 2019-04-28 DIAGNOSIS — N926 Irregular menstruation, unspecified: Secondary | ICD-10-CM | POA: Diagnosis not present

## 2019-04-28 DIAGNOSIS — Z124 Encounter for screening for malignant neoplasm of cervix: Secondary | ICD-10-CM | POA: Diagnosis not present

## 2019-05-03 ENCOUNTER — Ambulatory Visit (INDEPENDENT_AMBULATORY_CARE_PROVIDER_SITE_OTHER): Payer: BC Managed Care – PPO | Admitting: Family Medicine

## 2019-05-03 ENCOUNTER — Other Ambulatory Visit: Payer: Self-pay

## 2019-05-03 ENCOUNTER — Encounter: Payer: Self-pay | Admitting: Family Medicine

## 2019-05-03 VITALS — BP 112/80 | HR 80 | Temp 98.5°F | Ht 67.0 in | Wt 214.4 lb

## 2019-05-03 DIAGNOSIS — Z0000 Encounter for general adult medical examination without abnormal findings: Secondary | ICD-10-CM

## 2019-05-03 DIAGNOSIS — E669 Obesity, unspecified: Secondary | ICD-10-CM

## 2019-05-03 NOTE — Progress Notes (Signed)
Phone: [phone]   Subjective:  Patient presents today for their annual physical. Chief complaint-noted.   See problem oriented charting- ROS- full  review of systems was completed and negative except for:  Menstrual irregularities   The following were reviewed and entered/updated in epic: Past Medical History:  Diagnosis Date  . Asthma   . Rhinorrhea    stuffy, runny year round   Patient Active Problem List   Diagnosis Date Noted  . Asthma 12/23/2016  . Vitamin D deficiency 12/23/2016  . Cesarean delivery delivered 07/12/2016  . Allergic rhinitis 01/01/2016   Past Surgical History:  Procedure Laterality Date  . CESAREAN SECTION N/A 07/12/2016   Procedure: CESAREAN SECTION;  Surgeon: Silverio LaySandra Rivard, MD;  [facility];  Service: Obstetrics;  Laterality: N/A;  . none      Family History  Problem Relation Age of Onset  . Heart disease Paternal Grandfather        unknown specific ailment  . Hypertension Paternal Grandmother   . Diabetes Maternal Grandmother   . Hypertension Mother   . Sarcoidosis Father   . Hypertension Maternal Aunt     Medications- reviewed and updated No current outpatient medications on file.   No current facility-administered medications for this visit.     Allergies-reviewed and updated No Known Allergies  Social History   Social History Narrative   Family: Married. Enriqueta Shutteraniel Jr. 18 months in 12/2017. 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          Hobbies: four wheelers, enjoys travel   Objective  Objective:  BP 112/80 (BP Location: Left Arm, Patient Position: Sitting, Cuff Size: Large)   Pulse 80   Temp 98.5 F (36.9 C) (Oral)   Ht 5\' 7"  (1.702 m)   Wt 214 lb 6.4 oz (97.3 kg)   LMP 04/11/2019   SpO2 97%   BMI 33.58  kg/m  Gen: NAD, resting comfortably HEENT: Mucous membranes are moist. Oropharynx normal Neck: no thyromegaly or cervical lymphadenopathy CV: RRR no murmurs rubs or gallops Lungs: CTAB no crackles, wheeze, rhonchi Abdomen: soft/nontender/nondistended/normal bowel sounds. No rebound or guarding. obese Ext: no edema Skin: warm, dry Neuro: grossly normal, moves all extremities, PERRLA    Assessment and Plan    28 y.o. female presenting for annual physical.  Health Maintenance counseling: 1. Anticipatory guidance: Patient counseled regarding regular dental exams -q6 months, eye exams - yearly for conatacts,  avoiding smoking and second hand smoke , limiting alcohol to 1 beverage per day- well under this .   2. Risk factor reduction:  Advised patient of need for regular exercise and diet rich and fruits and vegetables to reduce risk of heart attack and stroke. Exercise- started personal training since last visit. Diet-feels like she is losing inches but not lbs- has improved diet some- trying to limit sweets/limiting them. Has tried to cut out sodas and cut down on coffee. Drinking more water. Set goal 21 lbs in 6 months and perhaps in 1 year under 180 Wt Readings from Last 3 Encounters:  05/03/19 214 lb 6.4 oz (97.3 kg)  03/22/19 198 lb (89.8 kg)  01/22/18 198 lb 9.6 oz (90.1 kg)  3. Immunizations/screenings/ancillary studies-recommend for flu shot in the fall Immunization History  Administered Date(s) Administered  . HPV  Quadrivalent 10/02/2014, 12/01/2014, 04/24/2015  . Influenza,inj,Quad PF,6+ Mos 07/13/2017, 09/19/2018  . Tdap 04/02/2016  4. Cervical cancer screening- March 2018 with 3-year follow-up recommended. Just did another one within a week- will get records 5. Breast cancer screening-  breast exam with GYN and mammogram-likely start yearly at age 20 6. Colon cancer screening -  no family history, start at age 50-50  7. Skin cancer screening-lower risk due to melanin content in  skin. advised regular sunscreen use. Denies worrisome, changing, or new skin lesions.  26. Birth control/STD check- monogamous she would not mind getting pregnant- no protection currently 9. Osteoporosis screening at 89- will plan on this  -10.  Never smoker  Status of chronic or acute concerns  Asthma - No meds, no recent flare ups.   Vitamin D Deficiency - Not currently taking any supplements. Will check vitamin d with labs at gyn. She is going ot add a multivitamin  Possible PCOS/secondary amenorrhea- testosterone high and other labs ok. Possible pcos. She is working with gynecologist. Did have period 2 weeks after starting to work out   Recommended follow up: 1 year physical recommended  Lab/Order associations:no labs today    ICD-10-CM   1. Preventative health care  Z00.00    Return precautions advised.  Garret Reddish, MD

## 2019-05-03 NOTE — Patient Instructions (Addendum)
Health Maintenance Due  Topic Date Due  . INFLUENZA VACCINE- recommended fall flu shot 04/30/2019   We discussed doing some additional labs including cbc, cmp, vitamin D and lipid panel- you wanted to defer for now since [facility] is [facility] to you- they also discussed getting insulin resistance test.   If they prefer to have them done here let me know and I will order them for you   I like your goal of 20 lbs in 6 months and then being in 180s by 1 year physical- you can do this!

## 2019-05-09 ENCOUNTER — Other Ambulatory Visit: Payer: Self-pay

## 2019-05-09 DIAGNOSIS — Z20822 Contact with and (suspected) exposure to covid-19: Secondary | ICD-10-CM

## 2019-05-10 LAB — NOVEL CORONAVIRUS, NAA: SARS-CoV-2, NAA: NOT DETECTED

## 2019-05-20 DIAGNOSIS — E282 Polycystic ovarian syndrome: Secondary | ICD-10-CM | POA: Diagnosis not present

## 2019-08-17 DIAGNOSIS — Z20828 Contact with and (suspected) exposure to other viral communicable diseases: Secondary | ICD-10-CM | POA: Diagnosis not present

## 2019-09-28 DIAGNOSIS — N912 Amenorrhea, unspecified: Secondary | ICD-10-CM | POA: Diagnosis not present

## 2019-09-30 NOTE — L&D Delivery Note (Signed)
Delivery Note:   G3P1011 at [redacted]w[redacted]d  Admitting diagnosis: Post-dates pregnancy [O48.0] Risks: TOLAC, Covid 19 positive, asymptomatic Onset of labor: 0241 06/07/2020 IOL/Augmentation: AROM, Pitocin and IP Foley ROM: 0241 06/07/2020, clear AF  Complete dilation at 06/07/2020  1645 Onset of pushing at 1645 FHR second stage cat 1  Analgesia /Anesthesia intrapartum:Epidural  Pushing in lateral R and L, and lithotomy position with CNM and L&D staff support, FOB present for birth and supportive.  Delivery of a Live born female  Birth Weight:  pending APGAR: 8, 9  Newborn Delivery   Birth date/time: 06/07/2020 18:48:00 Delivery type: VBAC, Spontaneous      in cephalic presentation, position LOP to OP. Nuchal Cord: No  Cord double clamped after cessation of pulsation, cut by FOB.  Collection of cord blood for typing completed. Cord blood donation-None  Arterial cord blood sample-No    Placenta delivered-Spontaneous  with 3 vessels . Uterotonics: Pitocin IV bolus Placenta to L&D for disposal. Uterine tone firm, bleeding small  2nd degree;Perineal  and vaginal multiple lacerations  Episiotomy:None  Local analgesia: NA  Repair: 2.0 and 3.0 vicryl, good hemostasis Est. Blood Loss (mL):300 Complications: None   Mom to postpartum.  Baby Denzel to Couplet care / Skin to Skin.  Delivery Report:  Review the Delivery Report for details.     Signed: Clancy Gourd, MSN 06/07/2020, 7:40 PM

## 2019-10-07 ENCOUNTER — Ambulatory Visit: Payer: BC Managed Care – PPO | Attending: Internal Medicine

## 2019-10-07 DIAGNOSIS — Z20822 Contact with and (suspected) exposure to covid-19: Secondary | ICD-10-CM | POA: Diagnosis not present

## 2019-10-08 LAB — NOVEL CORONAVIRUS, NAA: SARS-CoV-2, NAA: NOT DETECTED

## 2019-11-07 DIAGNOSIS — N925 Other specified irregular menstruation: Secondary | ICD-10-CM | POA: Diagnosis not present

## 2019-11-07 DIAGNOSIS — Z113 Encounter for screening for infections with a predominantly sexual mode of transmission: Secondary | ICD-10-CM | POA: Diagnosis not present

## 2019-11-07 LAB — OB RESULTS CONSOLE HIV ANTIBODY (ROUTINE TESTING): HIV: NONREACTIVE

## 2019-11-07 LAB — OB RESULTS CONSOLE GC/CHLAMYDIA
Chlamydia: NEGATIVE
Gonorrhea: NEGATIVE

## 2019-11-07 LAB — OB RESULTS CONSOLE RPR: RPR: NONREACTIVE

## 2019-11-07 LAB — OB RESULTS CONSOLE RUBELLA ANTIBODY, IGM: Rubella: IMMUNE

## 2019-11-07 LAB — OB RESULTS CONSOLE HEPATITIS B SURFACE ANTIGEN: Hepatitis B Surface Ag: NEGATIVE

## 2019-11-14 ENCOUNTER — Encounter (HOSPITAL_COMMUNITY): Payer: Self-pay | Admitting: Obstetrics and Gynecology

## 2019-11-14 ENCOUNTER — Other Ambulatory Visit: Payer: Self-pay

## 2019-11-14 ENCOUNTER — Inpatient Hospital Stay (HOSPITAL_COMMUNITY)
Admission: AD | Admit: 2019-11-14 | Discharge: 2019-11-14 | Disposition: A | Discharge status: Home / Self Care | Payer: BC Managed Care – PPO | Attending: Obstetrics and Gynecology | Admitting: Obstetrics and Gynecology

## 2019-11-14 ENCOUNTER — Inpatient Hospital Stay (HOSPITAL_COMMUNITY): Payer: BC Managed Care – PPO

## 2019-11-14 DIAGNOSIS — O418X10 Other specified disorders of amniotic fluid and membranes, first trimester, not applicable or unspecified: Secondary | ICD-10-CM

## 2019-11-14 DIAGNOSIS — O468X1 Other antepartum hemorrhage, first trimester: Secondary | ICD-10-CM | POA: Diagnosis not present

## 2019-11-14 DIAGNOSIS — O26891 Other specified pregnancy related conditions, first trimester: Secondary | ICD-10-CM | POA: Diagnosis not present

## 2019-11-14 DIAGNOSIS — O34219 Maternal care for unspecified type scar from previous cesarean delivery: Secondary | ICD-10-CM | POA: Diagnosis not present

## 2019-11-14 DIAGNOSIS — Z3A00 Weeks of gestation of pregnancy not specified: Secondary | ICD-10-CM | POA: Diagnosis not present

## 2019-11-14 DIAGNOSIS — O208 Other hemorrhage in early pregnancy: Secondary | ICD-10-CM | POA: Diagnosis not present

## 2019-11-14 DIAGNOSIS — Z833 Family history of diabetes mellitus: Secondary | ICD-10-CM | POA: Insufficient documentation

## 2019-11-14 DIAGNOSIS — Z3A12 12 weeks gestation of pregnancy: Secondary | ICD-10-CM | POA: Diagnosis not present

## 2019-11-14 DIAGNOSIS — Z6790 Unspecified blood type, Rh positive: Secondary | ICD-10-CM | POA: Diagnosis not present

## 2019-11-14 DIAGNOSIS — O99511 Diseases of the respiratory system complicating pregnancy, first trimester: Secondary | ICD-10-CM | POA: Diagnosis not present

## 2019-11-14 DIAGNOSIS — J45909 Unspecified asthma, uncomplicated: Secondary | ICD-10-CM | POA: Diagnosis not present

## 2019-11-14 DIAGNOSIS — O4690 Antepartum hemorrhage, unspecified, unspecified trimester: Secondary | ICD-10-CM

## 2019-11-14 LAB — COMPREHENSIVE METABOLIC PANEL
ALT: 17 U/L (ref 0–44)
AST: 16 U/L (ref 15–41)
Albumin: 3.1 g/dL — ABNORMAL LOW (ref 3.5–5.0)
Alkaline Phosphatase: 72 U/L (ref 38–126)
Anion gap: 9 (ref 5–15)
BUN: 12 mg/dL (ref 6–20)
CO2: 23 mmol/L (ref 22–32)
Calcium: 9.3 mg/dL (ref 8.9–10.3)
Chloride: 103 mmol/L (ref 98–111)
Creatinine, Ser: 0.65 mg/dL (ref 0.44–1.00)
GFR calc Af Amer: >60 mL/min (ref >60)
GFR calc non Af Amer: >60 mL/min (ref >60)
Glucose, Bld: 81 mg/dL (ref 70–99)
Potassium: 4.1 mmol/L (ref 3.5–5.1)
Sodium: 135 mmol/L (ref 135–145)
Total Bilirubin: 0.4 mg/dL (ref 0.3–1.2)
Total Protein: 7.2 g/dL (ref 6.5–8.1)

## 2019-11-14 LAB — CBC
HCT: 38.9 % (ref 36.0–46.0)
Hemoglobin: 12.9 g/dL (ref 12.0–15.0)
MCH: 29.7 pg (ref 26.0–34.0)
MCHC: 33.2 g/dL (ref 30.0–36.0)
MCV: 89.6 fL (ref 80.0–100.0)
Platelets: 308 10*3/uL (ref 150–400)
RBC: 4.34 MIL/uL (ref 3.87–5.11)
RDW: 14 % (ref 11.5–15.5)
WBC: 11.7 10*3/uL — ABNORMAL HIGH (ref 4.0–10.5)
nRBC: 0 % (ref 0.0–0.2)

## 2019-11-14 LAB — URINALYSIS, ROUTINE W REFLEX MICROSCOPIC
Bilirubin Urine: NEGATIVE
Glucose, UA: NEGATIVE mg/dL
Ketones, ur: NEGATIVE mg/dL
Leukocytes,Ua: NEGATIVE
Nitrite: NEGATIVE
Protein, ur: NEGATIVE mg/dL
Specific Gravity, Urine: 1.006 (ref 1.005–1.030)
pH: 6 (ref 5.0–8.0)

## 2019-11-14 LAB — HCG, QUANTITATIVE, PREGNANCY: hCG, Beta Chain, Quant, S: 55429 m[IU]/mL — ABNORMAL HIGH (ref <5)

## 2019-11-14 LAB — POCT PREGNANCY, URINE: Preg Test, Ur: POSITIVE — AB

## 2019-11-14 NOTE — Discharge Instructions (Signed)
Abdominal Pain During Pregnancy  Abdominal pain is common during pregnancy, and has many possible causes. Some causes are more serious than others, and sometimes the cause is not known. Abdominal pain can be a sign that labor is starting. It can also be caused by normal growth and stretching of muscles and ligaments during pregnancy. Always tell your health care provider if you have any abdominal pain. Follow these instructions at home:  Do not have sex or put anything in your vagina until your pain goes away completely.  Get plenty of rest until your pain improves.  Drink enough fluid to keep your urine pale yellow.  Take over-the-counter and prescription medicines only as told by your health care provider.  Keep all follow-up visits as told by your health care provider. This is important. Contact a health care provider if:  Your pain continues or gets worse after resting.  You have lower abdominal pain that: ? Comes and goes at regular intervals. ? Spreads to your back. ? Is similar to menstrual cramps.  You have pain or burning when you urinate. Get help right away if:  You have a fever or chills.  You have vaginal bleeding.  You are leaking fluid from your vagina.  You are passing tissue from your vagina.  You have vomiting or diarrhea that lasts for more than 24 hours.  Your baby is moving less than usual.  You feel very weak or faint.  You have shortness of breath.  You develop severe pain in your upper abdomen. Summary  Abdominal pain is common during pregnancy, and has many possible causes.  If you experience abdominal pain during pregnancy, tell your health care provider right away.  Follow your health care provider's home care instructions and keep all follow-up visits as directed. This information is not intended to replace advice given to you by your health care provider. Make sure you discuss any questions you have with your health care  provider. Document Revised: 01/03/2019 Document Reviewed: 12/18/2016 Elsevier Patient Education  2020 [employer].  Miscarriage A miscarriage is the loss of an unborn baby (fetus) before the 20th week of pregnancy. Most miscarriages happen during the first 3 months of pregnancy. Sometimes, a miscarriage can happen before a woman knows that she is pregnant. Having a miscarriage can be an emotional experience. If you have had a miscarriage, talk with your health care provider about any questions you may have about miscarrying, the grieving process, and your plans for future pregnancy. What are the causes? A miscarriage may be caused by:  Problems with the genes or chromosomes of the fetus. These problems make it impossible for the baby to develop normally. They are often the result of random errors that occur early in the development of the baby, and are not passed from parent to child (not inherited).  Infection of the cervix or uterus.  Conditions that affect hormone balance in the body.  Problems with the cervix, such as the cervix opening and thinning before pregnancy is at term (cervical insufficiency).  Problems with the uterus. These may include: ? A uterus with an abnormal shape. ? Fibroids in the uterus. ? Congenital abnormalities. These are problems that were present at birth.  Certain medical conditions.  Smoking, drinking alcohol, or using drugs.  Injury (trauma). In many cases, the cause of a miscarriage is not known. What are the signs or symptoms? Symptoms of this condition include:  Vaginal bleeding or spotting, with or without cramps or pain.  Pain or cramping in the abdomen or lower back.  Passing fluid, tissue, or blood clots from the vagina. How is this diagnosed? This condition may be diagnosed based on:  A physical exam.  Ultrasound.  Blood tests.  Urine tests. How is this treated? Treatment for a miscarriage is sometimes not necessary if you  naturally pass all the tissue that was in your uterus. If necessary, this condition may be treated with:  Dilation and curettage (D&C). This is a procedure in which the cervix is stretched open and the lining of the uterus (endometrium) is scraped. This is done only if tissue from the fetus or placenta remains in the body (incomplete miscarriage).  Medicines, such as: ? Antibiotic medicine, to treat infection. ? Medicine to help the body pass any remaining tissue. ? Medicine to reduce (contract) the size of the uterus. These medicines may be given if you have a lot of bleeding. If you have Rh negative blood and your baby was Rh positive, you will need a shot of a medicine called Rh immunoglobulinto protect your future babies from Rh blood problems. "Rh-negative" and "Rh-positive" refer to whether or not the blood has a specific protein found on the surface of red blood cells (Rh factor). Follow these instructions at home: Medicines   Take over-the-counter and prescription medicines only as told by your health care provider.  If you were prescribed antibiotic medicine, take it as told by your health care provider. Do not stop taking the antibiotic even if you start to feel better.  Do not take NSAIDs, such as aspirin and ibuprofen, unless they are approved by your health care provider. These medicines can cause bleeding. Activity  Rest as directed. Ask your health care provider what activities are safe for you.  Have someone help with home and family responsibilities during this time. General instructions  Keep track of the number of sanitary pads you use each day and how soaked (saturated) they are. Write down this information.  Monitor the amount of tissue or blood clots that you pass from your vagina. Save any large amounts of tissue for your health care provider to examine.  Do not use tampons, douche, or have sex until your health care provider approves.  To help you and your  partner with the process of grieving, talk with your health care provider or seek counseling.  When you are ready, meet with your health care provider to discuss any important steps you should take for your health. Also, discuss steps you should take to have a healthy pregnancy in the future.  Keep all follow-up visits as told by your health care provider. This is important. Where to find more information  The American Congress of Obstetricians and Gynecologists: www.acog.org  [place] Department of Health and Cytogeneticist of Women's Health: http://hoffman.com/ Contact a health care provider if:  You have a fever or chills.  You have a foul smelling vaginal discharge.  You have more bleeding instead of less. Get help right away if:  You have severe cramps or pain in your back or abdomen.  You pass blood clots or tissue from your vagina that is walnut-sized or larger.  You soak more than 1 regular sanitary pad in an hour.  You become light-headed or weak.  You pass out.  You have feelings of sadness that take over your thoughts, or you have thoughts of hurting yourself. Summary  Most miscarriages happen in the first 3 months of pregnancy. Sometimes miscarriage happens  before a woman even knows that she is pregnant.  Follow your health care provider's instruction for home care. Keep all follow-up appointments.  To help you and your partner with the process of grieving, talk with your health care provider or seek counseling. This information is not intended to replace advice given to you by your health care provider. Make sure you discuss any questions you have with your health care provider. Document Revised: 01/07/2019 Document Reviewed: 10/21/2016 Elsevier Patient Education  2020 [employer]. Vaginal Bleeding During Pregnancy, First Trimester  A small amount of bleeding from the vagina (spotting) is relatively common during early pregnancy. It usually stops on its  own. Various things may cause bleeding or spotting during early pregnancy. Some bleeding may be related to the pregnancy, and some may not. In many cases, the bleeding is normal and is not a problem. However, bleeding can also be a sign of something serious. Be sure to tell your health care provider about any vaginal bleeding right away. Some possible causes of vaginal bleeding during the first trimester include:  Infection or inflammation of the cervix.  Growths (polyps) on the cervix.  Miscarriage or threatened miscarriage.  Pregnancy tissue developing outside of the uterus (ectopic pregnancy).  A mass of tissue developing in the uterus due to an egg being fertilized incorrectly (molar pregnancy). Follow these instructions at home: Activity  Follow instructions from your health care provider about limiting your activity. Ask what activities are safe for you.  If needed, make plans for someone to help with your regular activities.  Do not have sex or orgasms until your health care provider says that this is safe. General instructions  Take over-the-counter and prescription medicines only as told by your health care provider.  Pay attention to any changes in your symptoms.  Do not use tampons or douche.  Write down how many pads you use each day, how often you change pads, and how soaked (saturated) they are.  If you pass any tissue from your vagina, save the tissue so you can show it to your health care provider.  Keep all follow-up visits as told by your health care provider. This is important. Contact a health care provider if:  You have vaginal bleeding during any part of your pregnancy.  You have cramps or labor pains.  You have a fever. Get help right away if:  You have severe cramps in your back or abdomen.  You pass large clots or a large amount of tissue from your vagina.  Your bleeding increases.  You feel light-headed or weak, or you faint.  You have  chills.  You are leaking fluid or have a gush of fluid from your vagina. Summary  A small amount of bleeding (spotting) from the vagina is relatively common during early pregnancy.  Various things may cause bleeding or spotting in early pregnancy.  Be sure to tell your health care provider about any vaginal bleeding right away. This information is not intended to replace advice given to you by your health care provider. Make sure you discuss any questions you have with your health care provider. Document Revised: 01/04/2019 Document Reviewed: 12/18/2016 Elsevier Patient Education  2020 Elsevier Inc. Subchorionic Hematoma  A subchorionic hematoma is a gathering of blood between the outer wall of the embryo (chorion) and the inner wall of the womb (uterus). This condition can cause vaginal bleeding. If they cause little or no vaginal bleeding, early small hematomas usually shrink on their own and  do not affect your baby or pregnancy. When bleeding starts later in pregnancy, or if the hematoma is larger or occurs in older pregnant women, the condition may be more serious. Larger hematomas may get bigger, which increases the chances of miscarriage. This condition also increases the risk of:  Premature separation of the placenta from the uterus.  Premature (preterm) labor.  Stillbirth. What are the causes? The exact cause of this condition is not known. It occurs when blood is trapped between the placenta and the uterine wall because the placenta has separated from the original site of implantation. What increases the risk? You are more likely to develop this condition if:  You were treated with fertility medicines.  You conceived through in vitro fertilization (IVF). What are the signs or symptoms? Symptoms of this condition include:  Vaginal spotting or bleeding.  Contractions of the uterus. These cause abdominal pain. Sometimes you may have no symptoms and the bleeding may only be  seen when ultrasound images are taken (transvaginal ultrasound). How is this diagnosed? This condition is diagnosed based on a physical exam. This includes a pelvic exam. You may also have other tests, including:  Blood tests.  Urine tests.  Ultrasound of the abdomen. How is this treated? Treatment for this condition can vary. Treatment may include:  Watchful waiting. You will be monitored closely for any changes in bleeding. During this stage: ? The hematoma may be reabsorbed by the body. ? The hematoma may separate the fluid-filled space containing the embryo (gestational sac) from the wall of the womb (endometrium).  Medicines.  Activity restriction. This may be needed until the bleeding stops. Follow these instructions at home:  Stay on bed rest if told to do so by your health care provider.  Do not lift anything that is heavier than 10 lbs. (4.5 kg) or as told by your health care provider.  Do not use any products that contain nicotine or tobacco, such as cigarettes and e-cigarettes. If you need help quitting, ask your health care provider.  Track and write down the number of pads you use each day and how soaked (saturated) they are.  Do not use tampons.  Keep all follow-up visits as told by your health care provider. This is important. Your health care provider may ask you to have follow-up blood tests or ultrasound tests or both. Contact a health care provider if:  You have any vaginal bleeding.  You have a fever. Get help right away if:  You have severe cramps in your stomach, back, abdomen, or pelvis.  You pass large clots or tissue. Save any tissue for your health care provider to look at.  You have more vaginal bleeding, and you faint or become lightheaded or weak. Summary  A subchorionic hematoma is a gathering of blood between the outer wall of the placenta and the uterus.  This condition can cause vaginal bleeding.  Sometimes you may have no symptoms  and the bleeding may only be seen when ultrasound images are taken.  Treatment may include watchful waiting, medicines, or activity restriction. This information is not intended to replace advice given to you by your health care provider. Make sure you discuss any questions you have with your health care provider. Document Revised: 08/28/2017 Document Reviewed: 11/11/2016 Elsevier Patient Education  2020 [employer].

## 2019-11-14 NOTE — MAU Provider Note (Signed)
History     CSN: [id number]  Arrival date and time: 11/14/19 1719   First Provider Initiated Contact with Patient 11/14/19 1849      Chief Complaint  Patient presents with  . Vaginal Bleeding   Ms. Cheryl Curtis is a 29 y.o. G3P1011 at [redacted]w[redacted]d who presents to MAU for vaginal bleeding which began around 4PM today. Pt reports she went to use the restroom and found some blood in the toilet and when wiping and reports this happened three times. Pt reports she put on a pad, which she did have very minimal bleeding on. Pt reports no blood in toilet when she used the bathroom in MAU, just when wiping. Pt reports last intercourse on Friday.  Passing blood clots? no Blood soaking clothes? no Lightheaded/dizzy? no Significant pelvic pain or cramping? mild, intermittent Passed any tissue? no  Current pregnancy problems? +BV on NOB labs, pt notified today Blood Type? O Positive Allergies? NKDA Current medications? PNVs Current [facility] & next appt? [facility], 11/25/2019  Pt denies vaginal discharge/odor/itching. Pt denies N/V, abdominal pain, constipation, diarrhea, or urinary problems. Pt denies fever, chills, fatigue, sweating or changes in appetite. Pt denies SOB or chest pain. Pt denies dizziness, HA, light-headedness, weakness.   OB History    Gravida  3   Para  1   Term  1   Preterm      AB  1   Living  1     SAB      TAB  1   Ectopic      Multiple  0   Live Births  1           Past Medical History:  Diagnosis Date  . Asthma   . Rhinorrhea    stuffy, runny year round    Past Surgical History:  Procedure Laterality Date  . CESAREAN SECTION N/A 07/12/2016   Procedure: CESAREAN SECTION;  Surgeon: Silverio Lay, MD;  Location: [facility] BIRTHING SUITES;  Service: Obstetrics;  Laterality: N/A;  . none      Family History  Problem Relation Age of Onset  . Heart disease Paternal Grandfather        unknown specific ailment  . Hypertension Paternal Grandmother   .  Diabetes Maternal Grandmother   . Hypertension Mother   . Sarcoidosis Father   . Hypertension Maternal Aunt     Social History   Tobacco Use  . Smoking status: Never Smoker  . Smokeless tobacco: Never Used  Substance Use Topics  . Alcohol use: Not Currently    Comment: few times a month. as of 01/01/16 not drinking at all.   . Drug use: No    Allergies: No Known Allergies  Medications Prior to Admission  Medication Sig Dispense Refill Last Dose  . Prenatal Vit-Fe Fumarate-FA (PRENATAL MULTIVITAMIN) TABS tablet Take 1 tablet by mouth daily at 12 noon.       Review of Systems  Constitutional: Negative for chills, diaphoresis, fatigue and fever.  Eyes: Negative for visual disturbance.  Respiratory: Negative for shortness of breath.   Cardiovascular: Negative for chest pain.  Gastrointestinal: Negative for abdominal pain, constipation, diarrhea, nausea and vomiting.  Genitourinary: Positive for pelvic pain and vaginal bleeding. Negative for dysuria, flank pain, frequency, urgency and vaginal discharge.  Neurological: Negative for dizziness, weakness, light-headedness and headaches.   Physical Exam   Blood pressure 122/71, pulse 88, temperature (!) 97.5 F (36.4 C), temperature source Oral, last menstrual period 08/20/2019.  Patient Vitals for the  past 24 hrs:  BP Temp Temp src Pulse  11/14/19 1808 122/71 -- -- 88  11/14/19 1805 -- (!) 97.5 F (36.4 C) Oral --   Physical Exam  Constitutional: She is oriented to person, place, and time. She appears well-developed and well-nourished. No distress.  HENT:  Head: Normocephalic and atraumatic.  Respiratory: Effort normal.  GI: Soft.  Neurological: She is alert and oriented to person, place, and time.  Skin: She is not diaphoretic.  Psychiatric: She has a normal mood and affect. Her behavior is normal. Judgment and thought content normal.  Pt declines pelvic exam.  Results for orders placed or performed during the hospital  encounter of 11/14/19 (from the past 24 hour(s))  Pregnancy, urine POC     Status: Abnormal   Collection Time: 11/14/19  5:36 PM  Result Value Ref Range   Preg Test, Ur POSITIVE (A) NEGATIVE  Urinalysis, Routine w reflex microscopic     Status: Abnormal   Collection Time: 11/14/19  5:41 PM  Result Value Ref Range   Color, Urine STRAW (A) YELLOW   APPearance CLEAR CLEAR   Specific Gravity, Urine 1.006 1.005 - 1.030   pH 6.0 5.0 - 8.0   Glucose, UA NEGATIVE NEGATIVE mg/dL   Hgb urine dipstick LARGE (A) NEGATIVE   Bilirubin Urine NEGATIVE NEGATIVE   Ketones, ur NEGATIVE NEGATIVE mg/dL   Protein, ur NEGATIVE NEGATIVE mg/dL   Nitrite NEGATIVE NEGATIVE   Leukocytes,Ua NEGATIVE NEGATIVE   RBC / HPF 6-10 0 - 5 RBC/hpf   WBC, UA 0-5 0 - 5 WBC/hpf   Bacteria, UA RARE (A) NONE SEEN   Squamous Epithelial / LPF 0-5 0 - 5   Mucus PRESENT   CBC     Status: Abnormal   Collection Time: 11/14/19  7:03 PM  Result Value Ref Range   WBC 11.7 (H) 4.0 - 10.5 K/uL   RBC 4.34 3.87 - 5.11 MIL/uL   Hemoglobin 12.9 12.0 - 15.0 g/dL   HCT 38.9 36.0 - 46.0 %   MCV 89.6 80.0 - 100.0 fL   MCH 29.7 26.0 - 34.0 pg   MCHC 33.2 30.0 - 36.0 g/dL   RDW 14.0 11.5 - 15.5 %   Platelets 308 150 - 400 K/uL   nRBC 0.0 0.0 - 0.2 %   US OB Comp Less 14 Wks  Result Date: 11/14/2019 CLINICAL DATA:  Pregnant patient with cramping. EXAM: OBSTETRIC <14 WK ULTRASOUND TECHNIQUE: Transabdominal ultrasound was performed for evaluation of the gestation as well as the maternal uterus and adnexal regions. COMPARISON:  None. FINDINGS: Intrauterine gestational sac: Single. Yolk sac:  Visualized. Embryo:  Visualized. Cardiac Activity: Detected. Heart Rate: 150 bpm CRL: 50.16 mm   11 w 5 d                  [place] EDC: 05/30/2020 Subchorionic hemorrhage: Moderate subchorionic hemorrhage is identified. Maternal uterus/adnexae: Negative. IMPRESSION: Single living intrauterine pregnancy. Moderate subchorionic hemorrhage. Electronically Signed    By: Inge Rise M.D.   On: 11/14/2019 19:25   MAU Course  Procedures  MDM -r/o ectopic -UA: straw/lg hgb/rare bacteria, urine culture sent -CBC: WNL -CMP: pending at time of discharge -[place]: moderate [place] -hCG: pending at time of discharge -ABO: O Positive -WetPrep/GC/CT declined - pt reports they were recently done at her NOB visit and she was diagnosed with BV, but all else was normal -pt discharged to home in stable condition  Orders Placed This Encounter  Procedures  . Culture,  OB Urine    Standing Status:   Standing    Number of Occurrences:   1  . US OB Comp Less 14 Wks    Standing Status:   Standing    Number of Occurrences:   1    Order Specific Question:   Symptom/Reason for Exam    Answer:   Vaginal bleeding in pregnancy [[id number]]  . Urinalysis, Routine w reflex microscopic    Standing Status:   Standing    Number of Occurrences:   1  . CBC    Standing Status:   Standing    Number of Occurrences:   1  . Comprehensive metabolic panel    Standing Status:   Standing    Number of Occurrences:   1  . hCG, quantitative, pregnancy    Standing Status:   Standing    Number of Occurrences:   1  . Pregnancy, urine POC    Standing Status:   Standing    Number of Occurrences:   1  . Discharge patient    Order Specific Question:   Discharge disposition    Answer:   01-Home or Self Care [1]    Order Specific Question:   Discharge patient date    Answer:   11/14/2019    Assessment and Plan   1. Subchorionic hemorrhage of placenta in first trimester, single or unspecified fetus   2. Vaginal bleeding in pregnancy   3. Blood type, Rh positive   4. [redacted] weeks gestation of pregnancy    Allergies as of 11/14/2019   No Known Allergies     Medication List    TAKE these medications   prenatal multivitamin Tabs tablet Take 1 tablet by mouth daily at 12 noon.      -pt to f/u with OB in two weeks -discussed normal expectations with [facility] and link to miscarriage -strict  bleeding/pain/return MAU precautions given -pt discharged to home in stable condition  Odie Sera Masiel Gentzler 11/14/2019, 8:47 PM

## 2019-11-14 NOTE — MAU Note (Signed)
Pt stated she went to the BR today and had bloody urine come out. . Denies  any pain or cramping. Had positive GBS in Urine and will pick up Rx today.

## 2019-11-15 LAB — CULTURE, OB URINE: Culture: NO GROWTH

## 2019-11-19 LAB — OB RESULTS CONSOLE GBS: GBS: POSITIVE

## 2019-11-25 DIAGNOSIS — Z31430 Encounter of female for testing for genetic disease carrier status for procreative management: Secondary | ICD-10-CM | POA: Diagnosis not present

## 2019-11-25 DIAGNOSIS — Z3A13 13 weeks gestation of pregnancy: Secondary | ICD-10-CM | POA: Diagnosis not present

## 2019-11-25 DIAGNOSIS — Z3481 Encounter for supervision of other normal pregnancy, first trimester: Secondary | ICD-10-CM | POA: Diagnosis not present

## 2019-11-25 DIAGNOSIS — Z3401 Encounter for supervision of normal first pregnancy, first trimester: Secondary | ICD-10-CM | POA: Diagnosis not present

## 2019-12-20 ENCOUNTER — Other Ambulatory Visit (HOSPITAL_COMMUNITY): Payer: Self-pay | Admitting: Obstetrics and Gynecology

## 2019-12-20 DIAGNOSIS — R8271 Bacteriuria: Secondary | ICD-10-CM | POA: Diagnosis not present

## 2019-12-20 DIAGNOSIS — Z3A20 20 weeks gestation of pregnancy: Secondary | ICD-10-CM

## 2019-12-20 DIAGNOSIS — Z363 Encounter for antenatal screening for malformations: Secondary | ICD-10-CM

## 2020-01-17 ENCOUNTER — Other Ambulatory Visit (HOSPITAL_COMMUNITY): Payer: Self-pay | Admitting: Obstetrics and Gynecology

## 2020-01-17 ENCOUNTER — Ambulatory Visit (HOSPITAL_COMMUNITY)
Admission: RE | Admit: 2020-01-17 | Discharge: 2020-01-17 | Disposition: A | Discharge status: Home / Self Care | Payer: BC Managed Care – PPO | Source: Ambulatory Visit | Attending: Family Medicine | Admitting: Family Medicine

## 2020-01-17 ENCOUNTER — Other Ambulatory Visit (HOSPITAL_COMMUNITY): Payer: Self-pay | Admitting: *Deleted

## 2020-01-17 ENCOUNTER — Other Ambulatory Visit: Payer: Self-pay

## 2020-01-17 DIAGNOSIS — Z362 Encounter for other antenatal screening follow-up: Secondary | ICD-10-CM

## 2020-01-17 DIAGNOSIS — O99212 Obesity complicating pregnancy, second trimester: Secondary | ICD-10-CM

## 2020-01-17 DIAGNOSIS — Z3A20 20 weeks gestation of pregnancy: Secondary | ICD-10-CM

## 2020-01-17 DIAGNOSIS — E669 Obesity, unspecified: Secondary | ICD-10-CM | POA: Diagnosis not present

## 2020-01-17 DIAGNOSIS — Z363 Encounter for antenatal screening for malformations: Secondary | ICD-10-CM

## 2020-02-16 ENCOUNTER — Ambulatory Visit (HOSPITAL_COMMUNITY): Payer: BC Managed Care – PPO | Attending: Obstetrics and Gynecology

## 2020-02-16 ENCOUNTER — Other Ambulatory Visit: Payer: Self-pay

## 2020-02-16 DIAGNOSIS — Z362 Encounter for other antenatal screening follow-up: Secondary | ICD-10-CM | POA: Diagnosis not present

## 2020-02-16 DIAGNOSIS — Z3A25 25 weeks gestation of pregnancy: Secondary | ICD-10-CM

## 2020-02-16 DIAGNOSIS — E669 Obesity, unspecified: Secondary | ICD-10-CM | POA: Diagnosis not present

## 2020-02-16 DIAGNOSIS — O99212 Obesity complicating pregnancy, second trimester: Secondary | ICD-10-CM

## 2020-03-16 DIAGNOSIS — Z369 Encounter for antenatal screening, unspecified: Secondary | ICD-10-CM | POA: Diagnosis not present

## 2020-03-16 DIAGNOSIS — Z23 Encounter for immunization: Secondary | ICD-10-CM | POA: Diagnosis not present

## 2020-04-10 ENCOUNTER — Encounter: Payer: Self-pay | Admitting: Family Medicine

## 2020-04-10 ENCOUNTER — Telehealth (INDEPENDENT_AMBULATORY_CARE_PROVIDER_SITE_OTHER): Payer: BC Managed Care – PPO | Admitting: Family Medicine

## 2020-04-10 DIAGNOSIS — Z7189 Other specified counseling: Secondary | ICD-10-CM | POA: Diagnosis not present

## 2020-04-10 DIAGNOSIS — R0981 Nasal congestion: Secondary | ICD-10-CM

## 2020-04-10 DIAGNOSIS — Z7185 Encounter for immunization safety counseling: Secondary | ICD-10-CM

## 2020-04-10 NOTE — Progress Notes (Signed)
Virtual Visit via Video Note  I connected with Cheryl Curtis  on 04/10/20 at 10:00 AM EDT by a video enabled telemedicine application and verified that I am speaking with the correct person using two identifiers.  Location patient: [place] Location provider:work or home office Persons participating in the virtual visit: patient, provider  I discussed the limitations of evaluation and management by telemedicine and the availability of in person appointments. The patient expressed understanding and agreed to proceed.   HPI:  Acute visit for sinus issues - started about 10- 11 days ago - she, her husband, and her son all had a cold - her symptoms have persisted some -symptoms include sore throat, drainage, sinus congestion - is improving now but still has some sinus congestion -denies fevers, sinus pain, HA, tooth pain, SOB, persistent discolored mucus, SOB, wheezing, cough -she is pregnant - 33 weeks, prefers to avoid abx if possible -hx of asthma, but has not had any asthma symptoms with this, has not needed her inhaler -denies any flu or COVID19 exposures -is not vaccinated for COVID19  ROS: See pertinent positives and negatives per HPI.  Past Medical History:  Diagnosis Date  . Asthma   . Rhinorrhea    stuffy, runny year round    Past Surgical History:  Procedure Laterality Date  . CESAREAN SECTION N/A 07/12/2016   Procedure: CESAREAN SECTION;  Surgeon: Silverio Lay, MD;  Location: [facility] BIRTHING SUITES;  Service: Obstetrics;  Laterality: N/A;  . none      Family History  Problem Relation Age of Onset  . Heart disease Paternal Grandfather        unknown specific ailment  . Hypertension Paternal Grandmother   . Diabetes Maternal Grandmother   . Hypertension Mother   . Sarcoidosis Father   . Hypertension Maternal Aunt     SOCIAL HX: see hpi   Current Outpatient Medications:  .  Prenatal Vit-Fe Fumarate-FA (PRENATAL MULTIVITAMIN) TABS tablet, Take 1 tablet by mouth daily at 12  noon., Disp: , Rfl:   EXAM:  VITALS per patient if applicable: denies fever  GENERAL: alert, oriented, appears well and in no acute distress  HEENT: atraumatic, conjunttiva clear, no obvious abnormalities on inspection of external nose and ears  NECK: normal movements of the head and neck  LUNGS: on inspection no signs of respiratory distress, breathing rate appears normal, no obvious gross SOB, gasping or wheezing  CV: no obvious cyanosis  MS: moves all visible extremities without noticeable abnormality  PSYCH/NEURO: pleasant and cooperative, no obvious depression or anxiety, speech and thought processing grossly intact  ASSESSMENT AND PLAN:  Discussed the following assessment and plan:  Sinus congestion  -we discussed possible serious and likely etiologies, options for evaluation and workup, limitations of telemedicine visit vs in person visit, treatment, treatment risks and precautions. Pt prefers to treat via telemedicine empirically rather then risking or undertaking an in person visit at this moment. No severe or alarm symptoms. Since seems to be improving opted for nasal saline, OTC meds that are safe in pregnancy - consider abx if worsening or not resolving over next 3-4 days. Discussed COVID19 vaccine. Ob said she could get it, she is on the fence. discussed risks/benefits per current data. Patient agrees to seek prompt in person care if worsening, new symptoms arise, or if is not improving with treatment.   I discussed the assessment and treatment plan with the patient. The patient was provided an opportunity to ask questions and all were answered. The patient agreed  with the plan and demonstrated an understanding of the instructions.   The patient was advised to call back or seek an in-person evaluation if the symptoms worsen or if the condition fails to improve as anticipated.   Terressa Koyanagi, DO

## 2020-04-25 DIAGNOSIS — Z3A35 35 weeks gestation of pregnancy: Secondary | ICD-10-CM | POA: Diagnosis not present

## 2020-04-25 DIAGNOSIS — O321XX9 Maternal care for breech presentation, other fetus: Secondary | ICD-10-CM | POA: Diagnosis not present

## 2020-05-03 ENCOUNTER — Encounter: Payer: Self-pay | Admitting: Family Medicine

## 2020-05-03 ENCOUNTER — Other Ambulatory Visit: Payer: Self-pay

## 2020-05-03 ENCOUNTER — Ambulatory Visit (INDEPENDENT_AMBULATORY_CARE_PROVIDER_SITE_OTHER): Payer: BC Managed Care – PPO | Admitting: Family Medicine

## 2020-05-03 ENCOUNTER — Other Ambulatory Visit: Payer: Self-pay | Admitting: Family Medicine

## 2020-05-03 VITALS — BP 122/62 | HR 92 | Temp 98.2°F | Resp 18 | Ht 67.0 in | Wt 244.2 lb

## 2020-05-03 DIAGNOSIS — Z13220 Encounter for screening for lipoid disorders: Secondary | ICD-10-CM

## 2020-05-03 DIAGNOSIS — Z0000 Encounter for general adult medical examination without abnormal findings: Secondary | ICD-10-CM

## 2020-05-03 DIAGNOSIS — E559 Vitamin D deficiency, unspecified: Secondary | ICD-10-CM

## 2020-05-03 NOTE — Addendum Note (Signed)
Addended by: Doreen Salvage on: 05/03/2020 03:30 PM   Modules accepted: Orders

## 2020-05-03 NOTE — Patient Instructions (Addendum)
Health Maintenance Due  Topic Date Due  . COVID-19 Vaccine (1)- I like your idea of getting this after you give birth Never done  . PAP SMEAR-Modifier  - Sign release of information at the check out desk for last pap smear  12/25/2019  . INFLUENZA VACCINE - recommend fall flu shot- prioritize covid 19 shot first 04/29/2020   CONGRATULATIONS!!!! Tell Reuel Boom and Enriqueta Shutter congrats for me as well!   Please stop by lab before you go If you have mychart- we will send your results within 3 business days of [place] receiving them.  If you do not have mychart- we will call you about results within 5 business days of [place] receiving them.  *please note we are currently using [facility] which has a longer processing time than [place] typically so labs may not come back as quickly as in the past *please also note that you will see labs on mychart as soon as they post. I will later go in and write notes on them- will say "notes from Dr. Durene Cal"    Recommended follow up: No follow-ups on file.

## 2020-05-03 NOTE — Progress Notes (Addendum)
Phone [phone]   Subjective:  Patient presents today for their annual physical. Chief complaint-noted.   See problem oriented charting- Review of Systems  Constitutional: Negative for chills and fever.  HENT: Negative for congestion, ear discharge, hearing loss and nosebleeds.   Eyes: Negative for blurred vision and double vision.  Respiratory: Negative for cough and shortness of breath.   Cardiovascular: Negative for chest pain and palpitations.  Gastrointestinal: Negative for constipation, diarrhea, nausea and vomiting.  Genitourinary: Positive for frequency. Negative for dysuria.  Musculoskeletal: Positive for myalgias. Negative for back pain.  Skin: Negative for itching and rash.  Neurological: Negative for dizziness and headaches.  Endo/Heme/Allergies: Negative for polydipsia. Does not bruise/bleed easily.  Psychiatric/Behavioral: Negative for depression, substance abuse and suicidal ideas.  - The following were reviewed and entered/updated in epic: Past Medical History:  Diagnosis Date  . Asthma   . Rhinorrhea    stuffy, runny year round   Patient Active Problem List   Diagnosis Date Noted  . Asthma 12/23/2016    Priority: Medium  . Vitamin D deficiency 12/23/2016    Priority: Medium  . Anemia due to acute blood loss 06/09/2020  . VBAC (vaginal birth after Cesarean) 9/9 06/07/2020  . Second degree perineal laceration 06/07/2020  . Postpartum care following vaginal delivery 9/9 06/07/2020  . Post-dates pregnancy 06/06/2020  . COVID-19 virus infection 06/06/2020  . Allergic rhinitis 01/01/2016   Past Surgical History:  Procedure Laterality Date  . CESAREAN SECTION N/A 07/12/2016   Procedure: CESAREAN SECTION;  Surgeon: Silverio Lay, MD;  Location: [facility] BIRTHING SUITES;  Service: Obstetrics;  Laterality: N/A;  . none      Family History  Problem Relation Age of Onset  . Heart disease Paternal Grandfather        unknown specific ailment  . Hypertension  Paternal Grandmother   . Diabetes Maternal Grandmother   . Hypertension Mother   . Sarcoidosis Father   . Hypertension Maternal Aunt     Medications- reviewed and updated Current Outpatient Medications  Medication Sig Dispense Refill  . ibuprofen (ADVIL) 600 MG tablet Take 1 tablet (600 mg total) by mouth every 6 (six) hours. 30 tablet 0  . iron polysaccharides (NIFEREX) 150 MG capsule Take 1 capsule (150 mg total) by mouth daily. 30 capsule 1  . Prenatal Vit-Fe Fumarate-FA (PRENATAL MULTIVITAMIN) TABS tablet Take 1 tablet by mouth daily at 12 noon.     No current facility-administered medications for this visit.    Allergies-reviewed and updated No Known Allergies  Social History   Social History Narrative   Family: Married. Enriqueta Shutter. 18 months in 12/2017. 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          Hobbies: four wheelers, enjoys travel   Objective  Objective:  BP 122/62   Pulse 92   Temp 98.2 F (36.8 C) (Temporal)   Resp 18   Ht 5\' 7"  (1.702 m)   Wt 244 lb 3.2 oz (110.8 kg)   LMP 08/20/2019   SpO2 97%   BMI 38.25 kg/m  Gen: NAD, resting comfortably HEENT: Mucous membranes are moist. Oropharynx normal Neck: no thyromegaly CV: RRR no murmurs rubs or gallops Lungs: CTAB no crackles, wheeze, rhonchi Abdomen: visibly pregnant Ext: no edema Skin: warm, dry Neuro: grossly  normal, moves all extremities, PERRLA   Assessment and Plan   29 y.o. female presenting for annual physical.  Health Maintenance counseling: 1. Anticipatory guidance: Patient counseled regarding regular dental exams -q6 months, eye exams - yearly,  avoiding smoking and second hand smoke , limiting alcohol to 1 beverage per day-not drinking at all at present due to pregnancy.   2. Risk factor  reduction:  Advised patient of need for regular exercise and diet rich and fruits and vegetables to reduce risk of heart attack and stroke. Exercise- not recently- harder with pregnancy- if does walking would do morning or evening. Diet- reasonably healthy- some cravings with pregnancy.  Wt Readings from Last 3 Encounters:  06/06/20 252 lb (114.3 kg)  05/03/20 244 lb 3.2 oz (110.8 kg)  05/03/19 214 lb 6.4 oz (97.3 kg)  3. Immunizations/screenings/ancillary studies-plans to get COVID-19 vaccination after she gives birth.  We also discussed flu shot in the fall-would prioritize COVID-19 vaccination Immunization History  Administered Date(s) Administered  . HPV Quadrivalent 10/02/2014, 12/01/2014, 04/24/2015  . Influenza,inj,Quad PF,6+ Mos 07/13/2017, 09/19/2018  . Tdap 04/02/2016, 03/16/2020   4. Cervical cancer screening- March 2018 previous on file-we did not get records last year so we will repeat request. She is not sure she had one- plan may be post pregnancy 5. Breast cancer screening-  breast exam with GYN and mammogram-plan to start yearly screening at age 36 6. Colon cancer screening - no family history, start at age 50  7. Skin cancer screening-lower risk due to melanin content. advised regular sunscreen use. Denies worrisome, changing, or new skin lesions.  8. Birth control/STD check- currently pregnant!  Monogamous 9. Osteoporosis screening at 65-we will plan on this -Never smoker  Status of chronic or acute concerns   Asthma-no recent flareups.  No medications  Vitamin D deficiency-she is on prenatal only Last vitamin D Lab Results  Component Value Date   VD25OH 42 05/03/2020   Possible PCOS/secondary amenorrhea-thankfully this did not hold her back from getting pregnant and has not had glucose tolerance issues. Working out seemed to be helpful   Recommended follow up: Return in about 1 year (around 05/03/2021) for physical or sooner if needed.  Lab/Order associations: not  fasting   ICD-10-CM   1. Preventative health care  Z00.00 Lipid panel    Comprehensive metabolic panel    CANCELED: Comprehensive metabolic panel    CANCELED: Lipid panel  2. Vitamin D deficiency  E55.9 VITAMIN D 25 Hydroxy (Vit-D Deficiency, Fractures)    CANCELED: VITAMIN D 25 Hydroxy (Vit-D Deficiency, Fractures)  3. Screening for hyperlipidemia  Z13.220 Lipid panel    CANCELED: Lipid panel   No orders of the defined types were placed in this encounter.   Return precautions advised.  Tana Conch, MD

## 2020-05-04 LAB — COMPREHENSIVE METABOLIC PANEL
AG Ratio: 1.1 (calc) (ref 1.0–2.5)
ALT: 10 U/L (ref 6–29)
AST: 11 U/L (ref 10–30)
Albumin: 3.3 g/dL — ABNORMAL LOW (ref 3.6–5.1)
Alkaline phosphatase (APISO): 118 U/L (ref 31–125)
BUN: 10 mg/dL (ref 7–25)
CO2: 23 mmol/L (ref 20–32)
Calcium: 9.1 mg/dL (ref 8.6–10.2)
Chloride: 103 mmol/L (ref 98–110)
Creat: 0.93 mg/dL (ref 0.50–1.10)
Globulin: 3.1 g/dL (calc) (ref 1.9–3.7)
Glucose, Bld: 89 mg/dL (ref 65–99)
Potassium: 4.2 mmol/L (ref 3.5–5.3)
Sodium: 134 mmol/L — ABNORMAL LOW (ref 135–146)
Total Bilirubin: 0.3 mg/dL (ref 0.2–1.2)
Total Protein: 6.4 g/dL (ref 6.1–8.1)

## 2020-05-04 LAB — LIPID PANEL
Cholesterol: 191 mg/dL (ref <200)
HDL: 68 mg/dL (ref >=50)
LDL Cholesterol (Calc): 95 mg/dL (calc)
Non-HDL Cholesterol (Calc): 123 mg/dL (calc) (ref <130)
Total CHOL/HDL Ratio: 2.8 (calc) (ref <5.0)
Triglycerides: 190 mg/dL — ABNORMAL HIGH (ref <150)

## 2020-05-04 LAB — VITAMIN D 25 HYDROXY (VIT D DEFICIENCY, FRACTURES): Vit D, 25-Hydroxy: 42 ng/mL (ref 30–100)

## 2020-05-31 DIAGNOSIS — Z3A40 40 weeks gestation of pregnancy: Secondary | ICD-10-CM | POA: Diagnosis not present

## 2020-05-31 DIAGNOSIS — O480 Post-term pregnancy: Secondary | ICD-10-CM | POA: Diagnosis not present

## 2020-06-01 ENCOUNTER — Other Ambulatory Visit: Payer: Self-pay | Admitting: Obstetrics and Gynecology

## 2020-06-05 ENCOUNTER — Other Ambulatory Visit (HOSPITAL_COMMUNITY)
Admission: RE | Admit: 2020-06-05 | Discharge: 2020-06-05 | Disposition: A | Discharge status: Home / Self Care | Payer: BC Managed Care – PPO | Source: Ambulatory Visit | Attending: Obstetrics and Gynecology | Admitting: Obstetrics and Gynecology

## 2020-06-05 ENCOUNTER — Other Ambulatory Visit (HOSPITAL_COMMUNITY): Payer: BC Managed Care – PPO

## 2020-06-05 DIAGNOSIS — O34211 Maternal care for low transverse scar from previous cesarean delivery: Secondary | ICD-10-CM | POA: Diagnosis not present

## 2020-06-05 DIAGNOSIS — Z20822 Contact with and (suspected) exposure to covid-19: Secondary | ICD-10-CM | POA: Diagnosis not present

## 2020-06-05 DIAGNOSIS — Z3A41 41 weeks gestation of pregnancy: Secondary | ICD-10-CM | POA: Diagnosis not present

## 2020-06-05 DIAGNOSIS — O480 Post-term pregnancy: Secondary | ICD-10-CM | POA: Diagnosis not present

## 2020-06-05 DIAGNOSIS — O321XX0 Maternal care for breech presentation, not applicable or unspecified: Secondary | ICD-10-CM | POA: Diagnosis not present

## 2020-06-05 DIAGNOSIS — O9952 Diseases of the respiratory system complicating childbirth: Secondary | ICD-10-CM | POA: Diagnosis not present

## 2020-06-05 DIAGNOSIS — D62 Acute posthemorrhagic anemia: Secondary | ICD-10-CM | POA: Diagnosis not present

## 2020-06-05 DIAGNOSIS — O9081 Anemia of the puerperium: Secondary | ICD-10-CM | POA: Diagnosis not present

## 2020-06-05 DIAGNOSIS — Z01812 Encounter for preprocedural laboratory examination: Secondary | ICD-10-CM | POA: Diagnosis not present

## 2020-06-05 DIAGNOSIS — U071 COVID-19: Secondary | ICD-10-CM | POA: Diagnosis not present

## 2020-06-05 DIAGNOSIS — J45909 Unspecified asthma, uncomplicated: Secondary | ICD-10-CM | POA: Diagnosis not present

## 2020-06-05 DIAGNOSIS — O9852 Other viral diseases complicating childbirth: Secondary | ICD-10-CM | POA: Diagnosis not present

## 2020-06-05 DIAGNOSIS — O99824 Streptococcus B carrier state complicating childbirth: Secondary | ICD-10-CM | POA: Diagnosis not present

## 2020-06-05 LAB — SARS CORONAVIRUS 2 (TAT 6-24 HRS): SARS Coronavirus 2: POSITIVE — AB

## 2020-06-06 ENCOUNTER — Inpatient Hospital Stay (HOSPITAL_COMMUNITY)
Admission: AD | Admit: 2020-06-06 | Discharge: 2020-06-09 | DRG: 805 | Disposition: A | Discharge status: Home / Self Care | Payer: BC Managed Care – PPO | Attending: Obstetrics and Gynecology | Admitting: Obstetrics and Gynecology

## 2020-06-06 ENCOUNTER — Other Ambulatory Visit: Payer: Self-pay

## 2020-06-06 ENCOUNTER — Inpatient Hospital Stay (HOSPITAL_COMMUNITY): Payer: BC Managed Care – PPO

## 2020-06-06 ENCOUNTER — Encounter (HOSPITAL_COMMUNITY): Payer: Self-pay | Admitting: Obstetrics and Gynecology

## 2020-06-06 DIAGNOSIS — O480 Post-term pregnancy: Principal | ICD-10-CM | POA: Diagnosis present

## 2020-06-06 DIAGNOSIS — O34219 Maternal care for unspecified type scar from previous cesarean delivery: Secondary | ICD-10-CM | POA: Diagnosis present

## 2020-06-06 DIAGNOSIS — U071 COVID-19: Secondary | ICD-10-CM | POA: Diagnosis present

## 2020-06-06 DIAGNOSIS — O34211 Maternal care for low transverse scar from previous cesarean delivery: Secondary | ICD-10-CM | POA: Diagnosis present

## 2020-06-06 DIAGNOSIS — O9852 Other viral diseases complicating childbirth: Secondary | ICD-10-CM | POA: Diagnosis present

## 2020-06-06 DIAGNOSIS — D62 Acute posthemorrhagic anemia: Secondary | ICD-10-CM | POA: Diagnosis not present

## 2020-06-06 DIAGNOSIS — Z01812 Encounter for preprocedural laboratory examination: Secondary | ICD-10-CM

## 2020-06-06 DIAGNOSIS — O321XX0 Maternal care for breech presentation, not applicable or unspecified: Secondary | ICD-10-CM | POA: Diagnosis present

## 2020-06-06 DIAGNOSIS — O9081 Anemia of the puerperium: Secondary | ICD-10-CM | POA: Diagnosis not present

## 2020-06-06 DIAGNOSIS — O99824 Streptococcus B carrier state complicating childbirth: Secondary | ICD-10-CM | POA: Diagnosis present

## 2020-06-06 DIAGNOSIS — Z3A41 41 weeks gestation of pregnancy: Secondary | ICD-10-CM | POA: Diagnosis not present

## 2020-06-06 LAB — CBC
HCT: 37.3 % (ref 36.0–46.0)
Hemoglobin: 12.1 g/dL (ref 12.0–15.0)
MCH: 29.3 pg (ref 26.0–34.0)
MCHC: 32.4 g/dL (ref 30.0–36.0)
MCV: 90.3 fL (ref 80.0–100.0)
Platelets: 255 10*3/uL (ref 150–400)
RBC: 4.13 MIL/uL (ref 3.87–5.11)
RDW: 14.4 % (ref 11.5–15.5)
WBC: 9 10*3/uL (ref 4.0–10.5)
nRBC: 0 % (ref 0.0–0.2)

## 2020-06-06 LAB — TYPE AND SCREEN
ABO/RH(D): O POS
Antibody Screen: NEGATIVE

## 2020-06-06 MED ORDER — LIDOCAINE HCL (PF) 1 % IJ SOLN: 30.0000 mL | INTRAMUSCULAR | Status: DC | PRN

## 2020-06-06 MED ORDER — SOD CITRATE-CITRIC ACID 500-334 MG/5ML PO SOLN: 30.0000 mL | ORAL | Status: DC | PRN

## 2020-06-06 MED ORDER — LACTATED RINGERS IV SOLN: 500.0000 mL | INTRAVENOUS | Status: DC | PRN

## 2020-06-06 MED ORDER — FENTANYL CITRATE (PF) 100 MCG/2ML IJ SOLN
50.0000 ug | INTRAMUSCULAR | Status: DC | PRN
  Administered 2020-06-06 (×2): 100 ug via INTRAVENOUS
  Filled 2020-06-06 (×2): qty 2

## 2020-06-06 MED ORDER — PENICILLIN G POT IN DEXTROSE 60000 UNIT/ML IV SOLN
3.0000 10*6.[IU] | INTRAVENOUS | Status: DC
  Administered 2020-06-06 – 2020-06-07 (×6): 3 10*6.[IU] via INTRAVENOUS
  Filled 2020-06-06 (×6): qty 50

## 2020-06-06 MED ORDER — ACETAMINOPHEN 325 MG PO TABS: 650.0000 mg | ORAL_TABLET | ORAL | Status: DC | PRN

## 2020-06-06 MED ORDER — LACTATED RINGERS IV SOLN: INTRAVENOUS | Status: DC

## 2020-06-06 MED ORDER — OXYTOCIN BOLUS FROM INFUSION
333.0000 mL | Freq: Once | INTRAVENOUS | Status: AC
  Administered 2020-06-07: 333 mL via INTRAVENOUS

## 2020-06-06 MED ORDER — TERBUTALINE SULFATE 1 MG/ML IJ SOLN: 0.2500 mg | Freq: Once | INTRAMUSCULAR | Status: DC | PRN

## 2020-06-06 MED ORDER — OXYTOCIN-SODIUM CHLORIDE 30-0.9 UT/500ML-% IV SOLN
2.5000 [IU]/h | INTRAVENOUS | Status: DC
  Filled 2020-06-06: qty 500

## 2020-06-06 MED ORDER — ONDANSETRON HCL 4 MG/2ML IJ SOLN
4.0000 mg | Freq: Four times a day (QID) | INTRAMUSCULAR | Status: DC | PRN
  Administered 2020-06-06: 4 mg via INTRAVENOUS
  Filled 2020-06-06: qty 2

## 2020-06-06 MED ORDER — OXYTOCIN-SODIUM CHLORIDE 30-0.9 UT/500ML-% IV SOLN
1.0000 m[IU]/min | INTRAVENOUS | Status: DC
  Administered 2020-06-06: 2 m[IU]/min via INTRAVENOUS
  Filled 2020-06-06 (×2): qty 500

## 2020-06-06 MED ORDER — SODIUM CHLORIDE 0.9 % IV SOLN
5.0000 10*6.[IU] | Freq: Once | INTRAVENOUS | Status: AC
  Administered 2020-06-06: 5 10*6.[IU] via INTRAVENOUS
  Filled 2020-06-06: qty 5

## 2020-06-06 NOTE — Progress Notes (Addendum)
Subjective:    Perceives mild ctx. Agrees to balloon placement. Induction qustions answered.   Objective:    VS: BP 129/75   Pulse 63   Temp 97.8 F (36.6 C) (Oral)   Resp 16   Ht 5\' 7"  (1.702 m)   Wt 114.3 kg   LMP 08/20/2019   BMI 39.47 kg/m  FHR : baseline 125 / variability moderate / accelerations present / absent decelerations Toco: contractions every 4-7 minutes  Membranes: intact Dilation: Closed Effacement (%): 20 Cervical Position: Middle Station: Ballotable Presentation: Vertex Exam by:: [email or web address] CNM Pitocin 6 mU/min  Assessment/Plan:   29 y.o. G3P1011 [redacted]w[redacted]d IOL for dates Prev C/S for breech desires TOLAC COVID positive    -contact and airborne precautions    -asymptomatic  Labor: Low-dose Pitocin w/ balloon 50ml uterine and 50ml vaginal, tolerated w/o diff.  Preeclampsia:  no signs or symptoms of toxicity Fetal Wellbeing:  Category I Pain Control:  Epidural-planning I/D:  GBS pos, PCN for prophylaxis Anticipated MOD:  NSVD   Plan to be reviewed w/ Dr. 72m and Su Hilt at sign-out  Connye Burkitt MSN, CNM 06/06/2020 6:59 PM

## 2020-06-06 NOTE — H&P (Addendum)
OB ADMISSION/ HISTORY & PHYSICAL:  Admission Date: 06/06/2020 10:49 AM  Admit Diagnosis: IOL @ 41+4, COVID positive, prev C/S for breech, desires TOLAC  Cheryl Curtis is a 29 y.o. female G3P1011 [redacted]w[redacted]d presenting for IOL. Endorses active FM, denies LOF and vaginal bleeding. Hx primary C/S for breech presentation. Desires TOLAC. Discussed induction method. Bedside US to confirm vertex.   History of current pregnancy: G3P1011   Patient entered care with [facility] at 11+5 wks.   EDC of 05/26/10 was established by U/S    Anatomy scan:  21+1 wks, with breech position and anterior placenta. Vtx @ 25 wks, 35, and 38 wks via U/A  Last evaluation: 40+1 wks BPP 8/8, vtx, nl fluid   Significant prenatal events:  Patient Active Problem List   Diagnosis Date Noted  . Post-dates pregnancy 06/06/2020  . Asthma 12/23/2016  . Vitamin D deficiency 12/23/2016  . Cesarean delivery delivered 07/12/2016  . Allergic rhinitis 01/01/2016    Prenatal Labs: ABO, Rh: --/--/O POS (09/08 1143) Antibody: NEG (09/08 1143) Rubella: Immune (02/08 0000)  RPR: Nonreactive (02/08 0000)  HBsAg: Negative (02/08 0000)  HIV: Non-reactive (02/08 0000)  GTT: passed 1 hr GBS: Positive/-- (02/20 0000)  GC/CHL: neg/neg Genetics: low-risk female, negative Horizon Tdap/influenza vaccines: declined flu, tdap this preg   OB History  Gravida Para Term Preterm AB Living  3 1 1   1 1   SAB TAB Ectopic Multiple Live Births    1   0 1    # Outcome Date GA Lbr Len/2nd Weight Sex Delivery Anes PTL Lv  3 Current           2 Term 07/12/16 [redacted]w[redacted]d  3895 g M CS-LVertical Spinal  LIV  1 TAB             Medical / Surgical History: Past medical history:  Past Medical History:  Diagnosis Date  . Asthma   . Rhinorrhea    stuffy, runny year round    Past surgical history:  Past Surgical History:  Procedure Laterality Date  . CESAREAN SECTION N/A 07/12/2016   Procedure: CESAREAN SECTION;  Surgeon: 07/14/2016, MD;  Location: [facility]  BIRTHING SUITES;  Service: Obstetrics;  Laterality: N/A;  . none     Family History:  Family History  Problem Relation Age of Onset  . Heart disease Paternal Grandfather        unknown specific ailment  . Hypertension Paternal Grandmother   . Diabetes Maternal Grandmother   . Hypertension Mother   . Sarcoidosis Father   . Hypertension Maternal Aunt     Social History:  reports that she has never smoked. She has never used smokeless tobacco. She reports previous alcohol use. She reports that she does not use drugs.  Allergies: Patient has no known allergies.   Current Medications at time of admission:  Prior to Admission medications   Medication Sig Start Date End Date Taking? Authorizing Provider  Prenatal Vit-Fe Fumarate-FA (PRENATAL MULTIVITAMIN) TABS tablet Take 1 tablet by mouth daily at 12 noon.    [provider]    Review of Systems: Constitutional: Negative   HENT: Negative   Eyes: Negative   Respiratory: Negative   Cardiovascular: Negative   Gastrointestinal: Negative  Genitourinary: neg for bloody show, neg for LOF   Musculoskeletal: Negative   Skin: Negative   Neurological: Negative   Endo/Heme/Allergies: Negative   Psychiatric/Behavioral: Negative    Physical Exam: VS: Blood pressure 126/80, pulse 92, temperature 98.6 F (37  C), temperature source Oral, resp. rate 14, height 5\' 7"  (1.702 m), weight 114.3 kg, last menstrual period 08/20/2019. AAO x3, no signs of distress Cardiovascular: RRR Respiratory: Lung fields clear to ausculation GU/GI: Abdomen gravid, non-tender, non-distended, active FM, vertex, EFW 6.5# per Leopold's, vertex per bedside U/S Extremities: trace edema, negative for pain, tenderness, and cords  Cervical exam:Dilation: Closed Effacement (%): 20 Station: Ballotable Exam by:: [email or web address] CNM FHR: baseline rate 135 / variability moderate / accelerations present / absent decelerations TOCO: none perceived and none  palpated   Prenatal Transfer Tool  Maternal Diabetes: No Genetic Screening: Normal Maternal Ultrasounds/Referrals: Normal Fetal Ultrasounds or other Referrals:  None Maternal Substance Abuse:  No Significant Maternal Medications:  None Significant Maternal Lab Results: Group B Strep positive    Assessment: 29 y.o. G3P1011 [redacted]w[redacted]d  IOL for dates Prev C/S for breech desires TOLAC COVID positive    -contact and airborne precautions    -asymptomatic FHR category 1 GBS positive Pain management plan: Epidural   Plan:  Admit to L&D Routine admission orders Low-dose Pitocin for ripening and then cervical balloon Epidural PRN GBS prophylaxis  Dr [redacted]w[redacted]d notified of admission and plan of care  Su Hilt MSN, CNM 06/06/2020 1:18 PM

## 2020-06-06 NOTE — Progress Notes (Signed)
Subjective:    Doing well. Reports adequate relief after Fentanyl. Traction applied to cervical balloon and balloon remain in place.   Objective:    VS: BP 132/79   Pulse 66   Temp 98.1 F (36.7 C)   Resp 18   Ht 5\' 7"  (1.702 m)   Wt 114.3 kg   LMP 08/20/2019   BMI 39.47 kg/m  FHR : baseline 120 / variability moderate / accelerations present / absent decelerations Toco: contractions every 2-4 minutes  Membranes: intact Dilation: 1 Effacement (%): 50 Cervical Position: Middle Station: -3 Presentation: Vertex Exam by:: Catie [email or web address] Pitocin 6 mU/min  Assessment/Plan:   29 y.o. G3P1011 [redacted]w[redacted]d IOL for dates Prev C/S for breech desires TOLAC COVID positive -contact and airborne precautions -asymptomatic  Labor: Cervical balloon remain in place, Pitocin @ 6 mU/min. Will increase Pitocin and AROM when balloon is expelled Preeclampsia:  no signs or symptoms of toxicity Fetal Wellbeing:  Category I Pain Control:   Plans Epidural and IV pain meds I/D:  GBS pos, PCN x3 doses Anticipated MOD:  NSVD  [redacted]w[redacted]d MSN, CNM 06/06/2020 10:54 PM

## 2020-06-07 ENCOUNTER — Encounter (HOSPITAL_COMMUNITY): Payer: Self-pay | Admitting: Obstetrics and Gynecology

## 2020-06-07 ENCOUNTER — Inpatient Hospital Stay (HOSPITAL_COMMUNITY): Payer: BC Managed Care – PPO | Admitting: Anesthesiology

## 2020-06-07 DIAGNOSIS — O34219 Maternal care for unspecified type scar from previous cesarean delivery: Secondary | ICD-10-CM | POA: Diagnosis present

## 2020-06-07 LAB — RPR: RPR Ser Ql: NONREACTIVE

## 2020-06-07 MED ORDER — FENTANYL-BUPIVACAINE-NACL 0.5-0.125-0.9 MG/250ML-% EP SOLN: 12.0000 mL/h | EPIDURAL | Status: DC | PRN

## 2020-06-07 MED ORDER — DIPHENHYDRAMINE HCL 50 MG/ML IJ SOLN: 12.5000 mg | INTRAMUSCULAR | Status: DC | PRN

## 2020-06-07 MED ORDER — SENNOSIDES-DOCUSATE SODIUM 8.6-50 MG PO TABS
2.0000 | ORAL_TABLET | ORAL | Status: DC
  Administered 2020-06-08 (×2): 2 via ORAL
  Filled 2020-06-07 (×2): qty 2

## 2020-06-07 MED ORDER — DIPHENHYDRAMINE HCL 25 MG PO CAPS: 25.0000 mg | ORAL_CAPSULE | Freq: Four times a day (QID) | ORAL | Status: DC | PRN

## 2020-06-07 MED ORDER — SIMETHICONE 80 MG PO CHEW: 80.0000 mg | CHEWABLE_TABLET | ORAL | Status: DC | PRN

## 2020-06-07 MED ORDER — SODIUM CHLORIDE (PF) 0.9 % IJ SOLN
INTRAMUSCULAR | Status: DC | PRN
Start: 2020-06-07 — End: 2020-06-07
  Administered 2020-06-07: 12 mL/h via EPIDURAL

## 2020-06-07 MED ORDER — DIBUCAINE (PERIANAL) 1 % EX OINT: 1.0000 "application " | TOPICAL_OINTMENT | CUTANEOUS | Status: DC | PRN

## 2020-06-07 MED ORDER — IBUPROFEN 600 MG PO TABS
600.0000 mg | ORAL_TABLET | Freq: Four times a day (QID) | ORAL | Status: DC
  Administered 2020-06-08 – 2020-06-09 (×6): 600 mg via ORAL
  Filled 2020-06-07 (×6): qty 1

## 2020-06-07 MED ORDER — PHENYLEPHRINE 40 MCG/ML (10ML) SYRINGE FOR IV PUSH (FOR BLOOD PRESSURE SUPPORT): 80.0000 ug | PREFILLED_SYRINGE | INTRAVENOUS | Status: DC | PRN

## 2020-06-07 MED ORDER — LIDOCAINE HCL (PF) 1 % IJ SOLN
INTRAMUSCULAR | Status: DC | PRN
  Administered 2020-06-07: 6 mL via EPIDURAL

## 2020-06-07 MED ORDER — ONDANSETRON HCL 4 MG/2ML IJ SOLN: 4.0000 mg | INTRAMUSCULAR | Status: DC | PRN

## 2020-06-07 MED ORDER — WITCH HAZEL-GLYCERIN EX PADS: 1.0000 "application " | MEDICATED_PAD | CUTANEOUS | Status: DC | PRN

## 2020-06-07 MED ORDER — EPHEDRINE 5 MG/ML INJ: 10.0000 mg | INTRAVENOUS | Status: DC | PRN

## 2020-06-07 MED ORDER — COCONUT OIL OIL
1.0000 "application " | TOPICAL_OIL | Status: DC | PRN
  Administered 2020-06-07: 1 via TOPICAL

## 2020-06-07 MED ORDER — PRENATAL MULTIVITAMIN CH: 1.0000 | ORAL_TABLET | Freq: Every day | ORAL | Status: DC

## 2020-06-07 MED ORDER — ZOLPIDEM TARTRATE 5 MG PO TABS: 5.0000 mg | ORAL_TABLET | Freq: Every evening | ORAL | Status: DC | PRN

## 2020-06-07 MED ORDER — LACTATED RINGERS IV BOLUS
500.0000 mL | Freq: Once | INTRAVENOUS | Status: AC
  Administered 2020-06-07: 500 mL via INTRAVENOUS

## 2020-06-07 MED ORDER — FENTANYL-BUPIVACAINE-NACL 0.5-0.125-0.9 MG/250ML-% EP SOLN
12.0000 mL/h | EPIDURAL | Status: DC | PRN
  Filled 2020-06-07: qty 250

## 2020-06-07 MED ORDER — LACTATED RINGERS IV SOLN: 500.0000 mL | Freq: Once | INTRAVENOUS | Status: DC

## 2020-06-07 MED ORDER — PRENATAL MULTIVITAMIN CH
1.0000 | ORAL_TABLET | Freq: Every day | ORAL | Status: DC
  Administered 2020-06-08: 1 via ORAL
  Filled 2020-06-07: qty 1

## 2020-06-07 MED ORDER — TETANUS-DIPHTH-ACELL PERTUSSIS 5-2.5-18.5 LF-MCG/0.5 IM SUSP: 0.5000 mL | Freq: Once | INTRAMUSCULAR | Status: DC

## 2020-06-07 MED ORDER — BENZOCAINE-MENTHOL 20-0.5 % EX AERO
1.0000 "application " | INHALATION_SPRAY | CUTANEOUS | Status: DC | PRN
  Administered 2020-06-07: 1 via TOPICAL
  Filled 2020-06-07: qty 56

## 2020-06-07 MED ORDER — ACETAMINOPHEN 325 MG PO TABS
650.0000 mg | ORAL_TABLET | ORAL | Status: DC | PRN
  Administered 2020-06-07 – 2020-06-09 (×2): 650 mg via ORAL
  Filled 2020-06-07 (×2): qty 2

## 2020-06-07 MED ORDER — ONDANSETRON HCL 4 MG PO TABS: 4.0000 mg | ORAL_TABLET | ORAL | Status: DC | PRN

## 2020-06-07 NOTE — Anesthesia Preprocedure Evaluation (Signed)
Anesthesia Evaluation  Patient identified by MRN, date of birth, ID band Patient awake    Reviewed: Allergy & Precautions, H&P , NPO status , Patient's Chart, lab work & pertinent test results, reviewed documented beta blocker date and time   Airway Mallampati: II  TM Distance: >3 FB Neck ROM: full    Dental no notable dental hx. (+) Teeth Intact, Dental Advisory Given   Pulmonary asthma ,    Pulmonary exam normal breath sounds clear to auscultation       Cardiovascular negative cardio ROS Normal cardiovascular exam Rhythm:regular Rate:Normal     Neuro/Psych negative neurological ROS  negative psych ROS   GI/Hepatic negative GI ROS, Neg liver ROS,   Endo/Other  negative endocrine ROS  Renal/GU negative Renal ROS  negative genitourinary   Musculoskeletal   Abdominal   Peds  Hematology negative hematology ROS (+)   Anesthesia Other Findings   Reproductive/Obstetrics (+) Pregnancy                             Anesthesia Physical Anesthesia Plan  ASA: III  Anesthesia Plan: Epidural   Post-op Pain Management:    Induction:   PONV Risk Score and Plan:   Airway Management Planned: Natural Airway  Additional Equipment: None  Intra-op Plan:   Post-operative Plan:   Informed Consent: I have reviewed the patients History and Physical, chart, labs and discussed the procedure including the risks, benefits and alternatives for the proposed anesthesia with the patient or authorized representative who has indicated his/her understanding and acceptance.       Plan Discussed with: Anesthesiologist  Anesthesia Plan Comments:         Anesthesia Quick Evaluation

## 2020-06-07 NOTE — Anesthesia Procedure Notes (Signed)
Epidural Patient location during procedure: OB Start time: 06/07/2020 8:02 AM End time: 06/07/2020 8:07 AM  Staffing Anesthesiologist: Bethena Midget, MD  Preanesthetic Checklist Completed: patient identified, IV checked, site marked, risks and benefits discussed, surgical consent, monitors and equipment checked, pre-op evaluation and timeout performed  Epidural Patient position: sitting Prep: DuraPrep and site prepped and draped Patient monitoring: continuous pulse ox and blood pressure Approach: midline Location: L3-L4 Injection technique: LOR air  Needle:  Needle type: Tuohy  Needle gauge: 17 G Needle length: 9 cm and 9 Needle insertion depth: 6 cm Catheter type: closed end flexible Catheter size: 19 Gauge Catheter at skin depth: 11 cm Test dose: negative  Assessment Events: blood not aspirated, injection not painful, no injection resistance, no paresthesia and negative IV test

## 2020-06-07 NOTE — Progress Notes (Signed)
S: Comfortable with epidural. Discussed the R/B/A of IUPC placement for contraction monitoring and patient consents to procedure.   O: Vitals:   06/07/20 0630 06/07/20 0825 06/07/20 0826 06/07/20 0831  BP: 117/68  115/68 117/67  Pulse: 73  67 71  Resp: 18  16 18   Temp:      TempSrc:      SpO2:  97%    Weight:      Height:       FHT:  FHR: 120 bpm, variability: minimal ,  accelerations:  Present,  decelerations:  Present early decels UC:   regular, every 3-4 minutes, palpate mild SVE:   Dilation: 5 Effacement (%): 50 Station: -3 Exam by: , CNM  AROM 9/9 @ 0241, clear fluid  IUPC placed without difficulty   A / P: Induction of labor due to postterm, s/p Cook catheter for cervical ripening, Pitocin infusing  Fetal Wellbeing:  Category I Pain Control:  Epidural Anticipated MOD:  Guarded   Change the bag of Pitocin. Titrate Pitocin until adequate MVUs. Encourage position changes to facilitate fetal rotation and descent.   Dr. 11/9 aware of patient status and plan of care.   Normand Sloop, CNM, MSN 06/07/2020, 9:18 AM

## 2020-06-07 NOTE — Progress Notes (Signed)
Subjective:    Resting. Discussed removal of cervical balloon and AROM if possible. Pt agrees.   Objective:    VS: BP (!) 112/53   Pulse 60   Temp 98.1 F (36.7 C) (Oral)   Resp 18   Ht 5\' 7"  (1.702 m)   Wt 114.3 kg   LMP 08/20/2019   BMI 39.47 kg/m  FHR : baseline 125 / variability moderate / accelerations present / absent decelerations Toco: contractions every 3-6 minutes  Membranes: AROM, clear Dilation: 4.5 Effacement (%): 70 Cervical Position: Middle Station: -2 Presentation: Vertex Exam by:: [email or web address] CNM Pitocin 6 mU/min  Assessment/Plan:   29 y.o. G3P1011 [redacted]w[redacted]d IOL for dates Prev C/S for breech desires TOLAC COVID positive -contact and airborne precautions -asymptomatic  Labor: S/P low-dose Pitocin and cervical balloon. Balloon expelled w/ gentle traction. AROM, clear fluid, will continue to increase Pitocin per protocol Preeclampsia:  no signs or symptoms of toxicity Fetal Wellbeing:  Category I Pain Control:  IV sedation x2 doses, plans epidural in active labor I/D:  GBS positive adequate treatment w/ PCN Anticipated MOD:  NSVD  [redacted]w[redacted]d MSN, CNM 06/07/2020 2:50 AM

## 2020-06-07 NOTE — Progress Notes (Signed)
S: Comfortable with epidural, has been sleeping.   O: Vitals:   06/07/20 1101 06/07/20 1131 06/07/20 1201 06/07/20 1417  BP: 118/72 130/76 123/82 118/62  Pulse: (!) 56 60 (!) 58 97  Resp: 18 18 18 18   Temp: 98 F (36.7 C)   98 F (36.7 C)  TempSrc: Oral   Oral  SpO2:      Weight:      Height:       FHT:  FHR: 120 bpm, variability: moderate,  accelerations:  Present,  decelerations:  Absent UC:   irregular, every 2-6 minutes, MVUs 210-250 SVE:   Dilation: 7 Effacement (%): 70 Station: -2 Exam by:: [email or web address] CNM   A / P: Induction of labor due to postterm, s/p, Cook catheter, Pitocin infusing at 12mu  Fetal Wellbeing:  Category I Pain Control:  Epidural Anticipated MOD:  Guarded, slow progress, fetal vertex still high  Will have patient sit upright for next 2 hours to encourage fetal descent. MVUs have been adequate for 4+ hours.   Discussed the option of elective repeat cesarean and pt declines at this time. Requests to continue labor.   Dr. 38m updated on patient status and plan of care.   Normand Sloop, CNM, MSN 06/07/2020, 2:30 PM

## 2020-06-07 NOTE — Lactation Note (Signed)
This note was copied from a baby's chart. Lactation Consultation Note RN told LC that mom would like to see Lactation but later, not now. RN stated baby is spitty at this time. Asked RN to call for [facility] when mom is ready.  Patient Name: Cheryl Curtis Today's Date: 06/07/2020     Maternal Data Formula Feeding for Exclusion: No  Feeding    LATCH Score Latch: Repeated attempts needed to sustain latch, nipple held in mouth throughout feeding, stimulation needed to elicit sucking reflex.  Audible Swallowing: None  Type of Nipple: Flat  Comfort (Breast/Nipple): Soft / non-tender  Hold (Positioning): Assistance needed to correctly position infant at breast and maintain latch.  LATCH Score: 5  Interventions    Lactation Tools Discussed/Used     Consult Status      Cheryl Curtis, Diamond Nickel 06/07/2020, 10:16 PM

## 2020-06-08 LAB — CBC
HCT: 28.5 % — ABNORMAL LOW (ref 36.0–46.0)
Hemoglobin: 9.3 g/dL — ABNORMAL LOW (ref 12.0–15.0)
MCH: 30 pg (ref 26.0–34.0)
MCHC: 32.6 g/dL (ref 30.0–36.0)
MCV: 91.9 fL (ref 80.0–100.0)
Platelets: 198 10*3/uL (ref 150–400)
RBC: 3.1 MIL/uL — ABNORMAL LOW (ref 3.87–5.11)
RDW: 14.6 % (ref 11.5–15.5)
WBC: 15.4 10*3/uL — ABNORMAL HIGH (ref 4.0–10.5)
nRBC: 0 % (ref 0.0–0.2)

## 2020-06-08 MED ORDER — POLYSACCHARIDE IRON COMPLEX 150 MG PO CAPS
150.0000 mg | ORAL_CAPSULE | Freq: Every day | ORAL | Status: DC
  Administered 2020-06-08 – 2020-06-09 (×2): 150 mg via ORAL
  Filled 2020-06-08 (×2): qty 1

## 2020-06-08 MED ORDER — MAGNESIUM OXIDE 400 (241.3 MG) MG PO TABS
400.0000 mg | ORAL_TABLET | Freq: Every day | ORAL | Status: DC
  Administered 2020-06-08 – 2020-06-09 (×2): 400 mg via ORAL
  Filled 2020-06-08 (×2): qty 1

## 2020-06-08 NOTE — Lactation Note (Signed)
This note was copied from a baby's chart. Lactation Consultation Note Baby 5 hrs old. Baby suckling on hands. RN stated baby is ready.  Mom is semi flat/very short shaft. Everts some w/stimulation.  Shells given for mom to wear in am.   Fitted mom #24 NS. Mom returned demonstration. Mom used NS w/her almost 30 yr old son for 2 months.  Latched baby w/o difficulty. Baby suckled on NS well. Heard occasional swallow. Positioned baby in football position. LC placed pillows for support.  Mom shown how to use DEBP & how to disassemble, clean, & reassemble parts. Mom knows to pump q3h for 15-20 min. Mom encouraged to feed baby 8-12 times/24 hours and with feeding cues. Mom encouraged to waken baby for feedings if hasn't cued in 3 hrs.  Newborn behavior, feeding habits, milk storage, supply and demand reviewed. Lactation brochure given. Encouraged to call for assistance or questions.   Patient Name: Cheryl Curtis Date: 06/08/2020 Reason for consult: Initial assessment;Term   Maternal Data Formula Feeding for Exclusion: No Has patient been taught Hand Expression?: Yes Does the patient have breastfeeding experience prior to this delivery?: Yes  Feeding Feeding Type: Breast Fed  LATCH Score Latch: Grasps breast easily, tongue down, lips flanged, rhythmical sucking.  Audible Swallowing: A few with stimulation  Type of Nipple: Everted at rest and after stimulation (very short shaft/semi flat)  Comfort (Breast/Nipple): Soft / non-tender  Hold (Positioning): Assistance needed to correctly position infant at breast and maintain latch.  LATCH Score: 8  Interventions Interventions: Breast feeding basics reviewed;Assisted with latch;Breast compression;Shells;Skin to skin;Adjust position;Breast massage;Support pillows;Hand pump;DEBP;Position options;Hand express;Pre-pump if needed  Lactation Tools Discussed/Used Tools: Shells;Pump;Nipple Shields Nipple shield size: 24 Shell  Type: Inverted Breast pump type: Double-Electric Breast Pump WIC Program: No Pump Review: Setup, frequency, and cleaning;Milk Storage Initiated by:: Peri Jefferson RN IBCLC Date initiated:: 06/08/20   Consult Status Consult Status: Follow-up Date: 06/08/20 (in pm) Follow-up type: In-patient    Charyl Dancer 06/08/2020, 12:24 AM

## 2020-06-08 NOTE — Progress Notes (Signed)
P/C to provider to report pt has been tachy 120-125 for both initial checks after admission to [facility].   Orders received 500 mg bolus LR and to have pt void.   Pt was unable to void prior to bolus.   Post bolus HR 84 and pt able to void.

## 2020-06-08 NOTE — Anesthesia Postprocedure Evaluation (Signed)
Anesthesia Post Note  Patient: Cheryl Curtis  Procedure(s) Performed: AN AD HOC LABOR EPIDURAL     Patient location during evaluation: Mother Baby Anesthesia Type: Epidural Level of consciousness: awake and alert Pain management: pain level controlled Vital Signs Assessment: post-procedure vital signs reviewed and stable Respiratory status: spontaneous breathing, nonlabored ventilation and respiratory function stable Cardiovascular status: stable Postop Assessment: no headache, no backache, epidural receding, no apparent nausea or vomiting, patient able to bend at knees, adequate PO intake and able to ambulate Anesthetic complications: no   No complications documented.  Last Vitals:  Vitals:   06/08/20 0301 06/08/20 0619  BP: 118/68 129/63  Pulse: 91 66  Resp: 17   Temp: 36.8 C 36.6 C  SpO2: 100%     Last Pain:  Vitals:   06/08/20 0815  TempSrc:   PainSc: 1    Pain Goal:                   Laban Emperor

## 2020-06-08 NOTE — Lactation Note (Signed)
This note was copied from a baby's chart. Lactation Consultation Note  Patient Name: Cheryl Curtis [id number] Date: 06/08/2020 Reason for consult: Follow-up assessment;Term 33 20hrs old, wt loss 2%, mom COVID+. Mom sitting in bed with mask on bottle feeding baby, dad resting on couch with mask on. Mom reports baby last fed at breast ~2:30p for ~74mins, was still fussy after feeding therefore gave formula. Mom reports not pumping consistently d/t not seeing milk expressed, however notes colostrum with hand expression. Mom's goal is to breastfeed/ provide breast milk as long as baby desires. Encouraged mom to pump after each feeding and hand express afterwards, offer colostrum back to baby, reinforced DEBP is for stimulation at this time. Mom reports noting increase in bra size and darkening to areola during pregnancy, advised these are positive findings r/t potential of milk supply. Foley cup and milk vials given. Mom states baby is fussy throughout feeding and pushes away, reports using size 58mm nipple shield with each feeding, encouraged mom to call for [facility] support to ensure baby is latched properly efficiently feeding. Reinforced cue based feedings, 8-12 in 24hrs, wake if last feeding >3hrs, skin to skin, [facility] BF brochure given with numbers for [facility] telephone and outpatient support. Mom voiced understanding and with no further concerns. BGilliam, RN, IBCLC  Maternal Data    Feeding  LATCH Score                   Interventions Interventions: Breast feeding basics reviewed;Skin to skin;Hand express;Expressed milk;DEBP  Lactation Tools Discussed/Used     Consult Status Consult Status: Follow-up Date: 06/08/20 Follow-up type: In-patient    [name] 06/08/2020, 3:44 PM

## 2020-06-08 NOTE — Progress Notes (Signed)
PPD# 1 SVD w/ 2nd degree and multiple vaginal lacerations Information for the patient's newborn:  Carine, Nordgren [[id number]]  female    Baby Name Denzel Circumcision planning   S:   Reports feeling very good Tolerating PO fluid and solids No nausea or vomiting Bleeding is light, no clots Pain controlled with PO meds Up ad lib / ambulatory / voiding w/o difficulty Feeding: Bottle and Breast    O:   VS: BP 132/71 (BP Location: Right Arm)   Pulse 77   Temp 98.4 F (36.9 C)   Resp 18   Ht 5\' 7"  (1.702 m)   Wt 114.3 kg   LMP 08/20/2019   SpO2 100%   Breastfeeding Unknown   BMI 39.47 kg/m   LABS:  Recent Labs    06/06/20 1143 06/08/20 0558  WBC 9.0 15.4*  HGB 12.1 9.3*  PLT 255 198   Blood type: --/--/O POS (09/08 1143) Rubella: Immune (02/08 0000)                      I&O: Intake/Output      09/09 0701 - 09/10 0700 09/10 0701 - 09/11 0700   I.V. (mL/kg) 489.3 (4.3)    Other 0    Total Intake(mL/kg) 489.3 (4.3)    Urine (mL/kg/hr) 1250 (0.5)    Blood 300    Total Output 1550    Net -1060.7           Physical Exam: Alert and oriented X3 Lungs: Clear and unlabored Heart: regular rate and rhythm / no mumurs Abdomen: soft, non-tender, non-distended  Fundus: firm, non-tender Perineum: well-approximated, edematous Lochia: appropriate Extremities: +1, non-pitting edema, no calf pain or tenderness    A:  PPD # 1 Normal exam VBAC COVID pos    -asymptomatic  P:  Routine post partum orders Remains under contact and airborne precautions Anticipate D/C on 06/09/20  Plan reviewed w/ Dr. 08/09/20, MSN, CNM 06/08/2020, 10:31 AM

## 2020-06-09 DIAGNOSIS — D62 Acute posthemorrhagic anemia: Secondary | ICD-10-CM | POA: Diagnosis not present

## 2020-06-09 MED ORDER — POLYSACCHARIDE IRON COMPLEX 150 MG PO CAPS: 150.0000 mg | ORAL_CAPSULE | Freq: Every day | ORAL | 1 refills | Status: DC

## 2020-06-09 MED ORDER — IBUPROFEN 600 MG PO TABS
600.0000 mg | ORAL_TABLET | Freq: Four times a day (QID) | ORAL | 0 refills | Status: DC
Start: 2020-06-09 — End: 2020-12-04

## 2020-06-09 NOTE — Lactation Note (Signed)
This note was copied from a baby's chart. Lactation Consultation Note  Patient Name: Cheryl Curtis Date: 06/09/2020 Reason for consult: Follow-up assessment;Term Baby 86hrs old, wt loss 1%, mom COVID+. Mom sitting in bed with mask on holding sleeping baby, dad resting on couch with mask on, RN in room reviewing discharge instructions. Mom reports plans to pump and bottle feed baby EBM and formula at this time, possibly work on latch at a later date, states baby just returned from circumcision. Mom concerned about having an adequate milk supply. Advised consistently pump q3-4hrs, if collecting <1oz per session by 1week PP consider meeting with an LC. Advised cue based feedings, wake if >3hrs since last feeding, skin to skin, continue to wear masks around baby until quarantine complete, benefits of colostrum for baby. Mom agreed to outpatient [facility] visit, message sent for scheduling. Advised mom to call for [facility] support if desires latch assistance prior to discharge. Mom voiced understanding and with no further concerns. BGilliam, RN, IBCLC  Maternal Data    Feeding Feeding Type: Bottle Fed - Formula Nipple Type: Slow - flow  LATCH Score                   Interventions Interventions: Breast feeding basics reviewed;Skin to skin  Lactation Tools Discussed/Used     Consult Status Consult Status: Complete Date: 06/09/20    Cheryl Curtis 06/09/2020, 12:20 PM

## 2020-06-09 NOTE — Discharge Summary (Addendum)
SVD OB Discharge Summary     Patient Name: Cheryl Curtis DOB: [birth year] MRN: [id number]  Date of admission: 06/06/2020 Delivering MD: Dorisann Frames K  Date of delivery: 06/07/2020 Type of delivery: SVD  Newborn Data: Sex: Baby Female Circumcision: Circ to be done at 10 today by Dr Sallye Ober Live born female  Birth Weight: 8 lb 1.5 oz (3671 g) APGAR: 8, 9  Newborn Delivery   Birth date/time: 06/07/2020 18:48:00 Delivery type: VBAC, Spontaneous      Feeding: breast Infant being discharge to home with mother in stable condition.   Admitting diagnosis: Post-dates pregnancy [O48.0] Intrauterine pregnancy: [redacted]w[redacted]d     Secondary diagnosis:  Principal Problem:   Postpartum care following vaginal delivery 9/9 Active Problems:   Post-dates pregnancy   COVID-19 virus infection   VBAC (vaginal birth after Cesarean) 9/9   Second degree perineal laceration   Anemia due to acute blood loss                                Complications: None                                                              Intrapartum Procedures: spontaneous vaginal delivery, GBS prophylaxis and TOLAC with VBAC Postpartum Procedures: none Complications-Operative and Postpartum: 2nd degree perineal laceration Augmentation: AROM, Pitocin and IP Foley   History of Present Illness: Ms. Cheryl Curtis is a 29 y.o. female, [id number], who presents at [redacted]w[redacted]d weeks gestation. The patient has been followed at  [facility]  Her pregnancy has been complicated by:  Patient Active Problem List   Diagnosis Date Noted  . Anemia due to acute blood loss 06/09/2020  . VBAC (vaginal birth after Cesarean) 9/9 06/07/2020  . Second degree perineal laceration 06/07/2020  . Postpartum care following vaginal delivery 9/9 06/07/2020  . Post-dates pregnancy 06/06/2020  . COVID-19 virus infection 06/06/2020  . Asthma 12/23/2016  . Vitamin D deficiency 12/23/2016  . Allergic rhinitis 01/01/2016    Hospital  course:  Induction of Labor With Vaginal Delivery   29 y.o. yo [id number] at [redacted]w[redacted]d was admitted to the hospital 06/06/2020 for induction of labor.  Indication for induction: Postdates.  Patient had an uncomplicated labor course as follows: Membrane Rupture Time/Date: 2:41 AM ,06/07/2020   Delivery Method:VBAC, Spontaneous  Episiotomy: None  Lacerations:  2nd degree;Perineal  Details of delivery can be found in separate delivery note.  Patient had a routine postpartum course. Patient is discharged home 06/09/20.  Newborn Data: Birth date:06/07/2020  Birth time:6:48 PM  Gender:Female  Living status:Living  Apgars:8 ,9  Weight:3671 g  Postpartum Day # 2 : S/P NSVD due to IOL @ 41+4, COVID positive asymptomatic, prev C/S for breech, desires TOLAC, progressed through labor with Foley bulb, pitocin and AROM, then a successful VBAC, with 2nd degree laceration, QBL with hgb drop from 12.1-9.3, on PO iron, asymptomatic. Patient up ad lib, denies syncope or dizziness. Reports consuming regular diet without issues and denies N/V. Patient reports 0 bowel movement + passing flatus.  Denies issues with urination and reports bleeding is "lighter."  Patient is breastfeeding and reports going well.  Desires undecided for postpartum contraception.  Pain  is being appropriately managed with use of po meds.   Physical exam  Vitals:   06/08/20 1200 06/08/20 1354 06/08/20 2342 06/09/20 0606  BP: 121/65 131/70 130/77 128/83  Pulse: 76 82 78 75  Resp: 20 18    Temp:  97.9 F (36.6 C) 98 F (36.7 C) 98.3 F (36.8 C)  TempSrc:  Oral  Oral  SpO2: 100% 99%    Weight:      Height:       General: alert, cooperative and no distress Lochia: appropriate Uterine Fundus: firm Perineum: approximate, no hematoma present DVT Evaluation: No evidence of DVT seen on physical exam. Negative Homan's sign. No cords or calf tenderness. No significant calf/ankle edema.  Labs: Lab Results  Component Value Date   WBC 15.4  (H) 06/08/2020   HGB 9.3 (L) 06/08/2020   HCT 28.5 (L) 06/08/2020   MCV 91.9 06/08/2020   PLT 198 06/08/2020   CMP Latest Ref Rng & Units 05/03/2020  Glucose 65 - 99 mg/dL 89  BUN 7 - 25 mg/dL 10  Creatinine 5.32 - 9.92 mg/dL 4.26  Sodium 834 - 196 mmol/L 134(L)  Potassium 3.5 - 5.3 mmol/L 4.2  Chloride 98 - 110 mmol/L 103  CO2 20 - 32 mmol/L 23  Calcium 8.6 - 10.2 mg/dL 9.1  Total Protein 6.1 - 8.1 g/dL 6.4  Total Bilirubin 0.2 - 1.2 mg/dL 0.3  Alkaline Phos 38 - 126 U/L -  AST 10 - 30 U/L 11  ALT 6 - 29 U/L 10    Date of discharge: 06/09/2020 Discharge Diagnoses: Term Pregnancy-delivered Discharge instruction: per After Visit Summary and "Baby and Me Booklet".  After visit meds:   Activity:           unrestricted and pelvic rest Advance as tolerated. Pelvic rest for 6 weeks.  Diet:                routine Medications: PNV, Ibuprofen, Colace and Iron Postpartum contraception: Undecided Condition:  Pt discharge to home with baby in stable Anemia: PO iron.  Covid Positive: if CP, SOB should occur please go to the ER.   Meds: Allergies as of 06/09/2020   No Known Allergies     Medication List    TAKE these medications   ibuprofen 600 MG tablet Commonly known as: ADVIL Take 1 tablet (600 mg total) by mouth every 6 (six) hours.   iron polysaccharides 150 MG capsule Commonly known as: NIFEREX Take 1 capsule (150 mg total) by mouth daily.   prenatal multivitamin Tabs tablet Take 1 tablet by mouth daily at 12 noon.       Discharge Follow Up:   Follow-up Information    [facility]. Schedule an appointment as soon as possible for a visit in 6 week(s).   Specialty: Obstetrics and Gynecology Contact information: [address] [place] [address] [phone]               [place], NP-C, CNM 06/09/2020, 7:08 AM  Dale [place], FNP

## 2020-06-11 ENCOUNTER — Encounter: Payer: Self-pay | Admitting: Family Medicine

## 2020-06-29 ENCOUNTER — Encounter: Payer: Self-pay | Admitting: Family Medicine

## 2020-07-04 ENCOUNTER — Encounter: Payer: Self-pay | Admitting: Family Medicine

## 2020-07-04 ENCOUNTER — Telehealth: Payer: Self-pay

## 2020-07-04 NOTE — Telephone Encounter (Signed)
Pt states her bill from Aug 5 is coded wrong. It was only a physical and she states she did not discuss any issues. She does not know why its coded as a vitamin d defiency. Pt said if you call her to leave a voicemail

## 2020-07-05 NOTE — Telephone Encounter (Signed)
This has been sent for correction and patient has been contacted in regard.

## 2020-08-21 DIAGNOSIS — N898 Other specified noninflammatory disorders of vagina: Secondary | ICD-10-CM | POA: Diagnosis not present

## 2020-10-06 ENCOUNTER — Other Ambulatory Visit: Payer: Self-pay

## 2020-10-06 ENCOUNTER — Other Ambulatory Visit: Payer: BC Managed Care – PPO

## 2020-10-06 DIAGNOSIS — Z20822 Contact with and (suspected) exposure to covid-19: Secondary | ICD-10-CM

## 2020-10-11 LAB — NOVEL CORONAVIRUS, NAA: SARS-CoV-2, NAA: NOT DETECTED

## 2020-12-04 ENCOUNTER — Ambulatory Visit (INDEPENDENT_AMBULATORY_CARE_PROVIDER_SITE_OTHER): Payer: BC Managed Care – PPO | Admitting: Physician Assistant

## 2020-12-04 ENCOUNTER — Other Ambulatory Visit: Payer: Self-pay

## 2020-12-04 ENCOUNTER — Encounter: Payer: Self-pay | Admitting: Physician Assistant

## 2020-12-04 VITALS — BP 120/70 | HR 77 | Temp 97.2°F | Ht 67.0 in | Wt 229.2 lb

## 2020-12-04 DIAGNOSIS — R21 Rash and other nonspecific skin eruption: Secondary | ICD-10-CM

## 2020-12-04 MED ORDER — TRIAMCINOLONE ACETONIDE 0.1 % EX CREA: TOPICAL_CREAM | CUTANEOUS | 0 refills | Status: DC

## 2020-12-04 NOTE — Patient Instructions (Signed)
It was great to see you!  Trial triamcinolone ointment -- if any worsening let me know and we will switch to fungal cream.  Send me a mychart message if you have concerns.  Take care,  Jarold Motto PA-C

## 2020-12-04 NOTE — Progress Notes (Signed)
Cheryl Curtis is a 30 y.o. female here for a new problem.  I acted as a Neurosurgeon for [employer], PA-C Corky Mull, LPN   History of Present Illness:   Chief Complaint  Patient presents with  . Rash    HPI   Rash Pt c/o rash on inner elbow area that started about 2 weeks ago comes and goes, itching. Husband had something similar as well that was less severe and has already resolved. Pt tried OTC cream for eczema did not help, but did not make worse. Denies new exposures at home -- except new detergent recently but does not think that is the culprit.  She may have had some eczema when she was younger, she is not certain.  Past Medical History:  Diagnosis Date  . Asthma   . Rhinorrhea    stuffy, runny year round     Social History   Tobacco Use  . Smoking status: Never Smoker  . Smokeless tobacco: Never Used  Substance Use Topics  . Alcohol use: Not Currently    Comment: few times a month. as of 01/01/16 not drinking at all.   . Drug use: No    Past Surgical History:  Procedure Laterality Date  . CESAREAN SECTION N/A 07/12/2016   Procedure: CESAREAN SECTION;  Surgeon: Silverio Lay, MD;  Location: [facility] BIRTHING SUITES;  Service: Obstetrics;  Laterality: N/A;  . none      Family History  Problem Relation Age of Onset  . Heart disease Paternal Grandfather        unknown specific ailment  . Hypertension Paternal Grandmother   . Diabetes Maternal Grandmother   . Hypertension Mother   . Sarcoidosis Father   . Hypertension Maternal Aunt     No Known Allergies  Current Medications:   Current Outpatient Medications:  .  triamcinolone (KENALOG) 0.1 %, Apply to affected area to 1-2 times daily, Disp: 30 g, Rfl: 0   Review of Systems:   ROS Negative unless otherwise specified per HPI.  Vitals:   Vitals:   12/04/20 1307  BP: 120/70  Pulse: 77  Temp: (!) 97.2 F (36.2 C)  TempSrc: Temporal  SpO2: 98%  Weight: 229 lb 4 oz (104 kg)  Height: 5\' 7"  (1.702  m)     Body mass index is 35.91 kg/m.  Physical Exam:   Physical Exam Constitutional:      Appearance: She is well-developed and well-nourished.  HENT:     Head: Normocephalic and atraumatic.  Eyes:     Extraocular Movements: EOM normal.     Conjunctiva/sclera: Conjunctivae normal.  Pulmonary:     Effort: Pulmonary effort is normal.  Musculoskeletal:        General: Normal range of motion.     Cervical back: Normal range of motion and neck supple.  Skin:    General: Skin is warm and dry.     Comments: Scattered erythematous well-circumscribed rashes to bilateral antecubital area  Neurological:     Mental Status: She is alert and oriented to person, place, and time.  Psychiatric:        Mood and Affect: Mood and affect normal.        Behavior: Behavior normal.        Thought Content: Thought content normal.        Judgment: Judgment normal.     Assessment and Plan:   [name] was seen today for rash.  Diagnoses and all orders for this visit:  Rash Suspect  possible eczema. Trial kenalog cream to see if this helps, if not and if makes worse, will trial ketoconazole for possible fungal etiology. Patient agreeable and understanding to plan.  Other orders -     triamcinolone (KENALOG) 0.1 %; Apply to affected area to 1-2 times daily   CMA or LPN served as scribe during this visit. History, Physical, and Plan performed by medical provider. The above documentation has been reviewed and is accurate and complete.  Jarold Motto, PA-C

## 2021-05-07 ENCOUNTER — Other Ambulatory Visit: Payer: Self-pay

## 2021-05-07 ENCOUNTER — Ambulatory Visit (INDEPENDENT_AMBULATORY_CARE_PROVIDER_SITE_OTHER): Payer: BC Managed Care – PPO | Admitting: Family Medicine

## 2021-05-07 ENCOUNTER — Encounter: Payer: Self-pay | Admitting: Family Medicine

## 2021-05-07 VITALS — BP 103/65 | HR 90 | Temp 98.2°F | Ht 67.0 in | Wt 228.6 lb

## 2021-05-07 DIAGNOSIS — Z0000 Encounter for general adult medical examination without abnormal findings: Secondary | ICD-10-CM | POA: Diagnosis not present

## 2021-05-07 DIAGNOSIS — N926 Irregular menstruation, unspecified: Secondary | ICD-10-CM

## 2021-05-07 DIAGNOSIS — Z13220 Encounter for screening for lipoid disorders: Secondary | ICD-10-CM | POA: Diagnosis not present

## 2021-05-07 DIAGNOSIS — E781 Pure hyperglyceridemia: Secondary | ICD-10-CM | POA: Diagnosis not present

## 2021-05-07 LAB — LIPID PANEL
Cholesterol: 170 mg/dL (ref 0–200)
HDL: 49.1 mg/dL (ref >39.00)
LDL Cholesterol: 95 mg/dL (ref 0–99)
NonHDL: 120.94
Total CHOL/HDL Ratio: 3
Triglycerides: 131 mg/dL (ref 0.0–149.0)
VLDL: 26.2 mg/dL (ref 0.0–40.0)

## 2021-05-07 LAB — CBC WITH DIFFERENTIAL/PLATELET
Basophils Absolute: 0 10*3/uL (ref 0.0–0.1)
Basophils Relative: 0.2 % (ref 0.0–3.0)
Eosinophils Absolute: 0.2 10*3/uL (ref 0.0–0.7)
Eosinophils Relative: 2.5 % (ref 0.0–5.0)
HCT: 40.3 % (ref 36.0–46.0)
Hemoglobin: 13.1 g/dL (ref 12.0–15.0)
Lymphocytes Relative: 25.6 % (ref 12.0–46.0)
Lymphs Abs: 2 10*3/uL (ref 0.7–4.0)
MCHC: 32.5 g/dL (ref 30.0–36.0)
MCV: 88.3 fl (ref 78.0–100.0)
Monocytes Absolute: 0.5 10*3/uL (ref 0.1–1.0)
Monocytes Relative: 5.8 % (ref 3.0–12.0)
Neutro Abs: 5.3 10*3/uL (ref 1.4–7.7)
Neutrophils Relative %: 65.9 % (ref 43.0–77.0)
Platelets: 339 10*3/uL (ref 150.0–400.0)
RBC: 4.57 Mil/uL (ref 3.87–5.11)
RDW: 14.4 % (ref 11.5–15.5)
WBC: 8 10*3/uL (ref 4.0–10.5)

## 2021-05-07 LAB — COMPREHENSIVE METABOLIC PANEL
ALT: 14 U/L (ref 0–35)
AST: 13 U/L (ref 0–37)
Albumin: 4.2 g/dL (ref 3.5–5.2)
Alkaline Phosphatase: 111 U/L (ref 39–117)
BUN: 9 mg/dL (ref 6–23)
CO2: 27 mEq/L (ref 19–32)
Calcium: 9.6 mg/dL (ref 8.4–10.5)
Chloride: 102 mEq/L (ref 96–112)
Creatinine, Ser: 0.82 mg/dL (ref 0.40–1.20)
GFR: 95.83 mL/min (ref >60.00)
Glucose, Bld: 79 mg/dL (ref 70–99)
Potassium: 3.7 mEq/L (ref 3.5–5.1)
Sodium: 135 mEq/L (ref 135–145)
Total Bilirubin: 0.3 mg/dL (ref 0.2–1.2)
Total Protein: 8 g/dL (ref 6.0–8.3)

## 2021-05-07 LAB — POCT URINE PREGNANCY: Preg Test, Ur: NEGATIVE

## 2021-05-07 NOTE — Progress Notes (Signed)
h Phone [phone]   Subjective:  Patient presents today for their annual physical. Chief complaint-noted.   See problem oriented charting- ROS- full  review of systems was completed and negative per full ROS sheet  The following were reviewed and entered/updated in epic: Past Medical History:  Diagnosis Date   Asthma    Rhinorrhea    stuffy, runny year round   Patient Active Problem List   Diagnosis Date Noted   Asthma 12/23/2016    Priority: Medium   Vitamin D deficiency 12/23/2016    Priority: Medium   Anemia due to acute blood loss 06/09/2020   VBAC (vaginal birth after Cesarean) 9/9 06/07/2020   Second degree perineal laceration 06/07/2020   Postpartum care following vaginal delivery 9/9 06/07/2020   Post-dates pregnancy 06/06/2020   COVID-19 virus infection 06/06/2020   Allergic rhinitis 01/01/2016   Past Surgical History:  Procedure Laterality Date   CESAREAN SECTION N/A 07/12/2016   Procedure: CESAREAN SECTION;  Surgeon: Silverio Lay, MD;  Location: [facility] BIRTHING SUITES;  Service: Obstetrics;  Laterality: N/A;   none      Family History  Problem Relation Age of Onset   Heart disease Paternal Grandfather        unknown specific ailment   Hypertension Paternal Grandmother    Diabetes Maternal Grandmother    Hypertension Mother    Sarcoidosis Father    Hypertension Maternal Aunt     Medications- reviewed and updated No current outpatient medications on file.   No current facility-administered medications for this visit.    Allergies-reviewed and updated No Known Allergies  Social History   Social History Narrative   Family: Married. Enriqueta Shutter. 18 months in 12/2017. 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          Hobbies: four wheelers, enjoys travel    Objective  Objective:  BP 103/65   Pulse 90   Temp 98.2 F (36.8 C) (Temporal)   Ht 5\' 7"  (1.702 m)   Wt 228 lb 9.6 oz (103.7 kg)   LMP 03/28/2021 (Approximate)   SpO2 98%   Breastfeeding No   BMI 35.80 kg/m  Gen: NAD, resting comfortably HEENT: Mucous membranes are moist. Oropharynx normal Neck: no thyromegaly CV: RRR no murmurs rubs or gallops Lungs: CTAB no crackles, wheeze, rhonchi Abdomen: soft/nontender/nondistended/normal bowel sounds. No rebound or guarding.  Ext: no edema Skin: warm, dry Neuro: grossly normal, moves all extremities, PERRLA  Pending pregnancy test Results for orders placed or performed in visit on 05/07/21 (from the past 24 hour(s))  POCT urine pregnancy     Status: Normal   Collection Time: 05/07/21 12:06 PM  Result Value Ref Range   Preg Test, Ur Negative Negative     Assessment and Plan   30 y.o. female presenting for annual physical.  Health Maintenance counseling: 1. Anticipatory guidance: Patient counseled regarding regular dental exams -q6 months, eye exams - yearly,  avoiding smoking and second hand smoke , limiting alcohol to 1 beverage per day- under this- not breastfeding .   2. Risk factor reduction:  Advised patient of need for regular exercise and diet rich and fruits and vegetables to reduce risk of heart attack and stroke. Exercise- personal trainer plans to restart in November- she is going to start once  or twice a week but is very busy right now with 2 little ones and work. Diet- has been improving diet- down 1 lb from march.  Wt Readings from Last 3 Encounters:  05/07/21 228 lb 9.6 oz (103.7 kg)  12/04/20 229 lb 4 oz (104 kg)  06/06/20 252 lb (114.3 kg)  3. Immunizations/screenings/ancillary studies- she is planning on flu shot in the fall- she will let us know when she gets this. May get 3rd booster at that time- she will let us know date as well Immunization History  Administered Date(s) Administered   HPV Quadrivalent  10/02/2014, 12/01/2014, 04/24/2015   Influenza,inj,Quad PF,6+ Mos 07/13/2017, 09/19/2018   PFIZER(Purple Top)SARS-COV-2 Vaccination 07/04/2020, 07/25/2020   Tdap 04/02/2016, 03/16/2020  4. Cervical cancer screening-last on file from 2018-we are going to get records as she feels like she has had this done since that time 5. Breast cancer screening-  breast exam with GYN and mammogram - has not started yet. No family history early breast cancer.  6. Colon cancer screening - no family history, start at age 37 7. Skin cancer screening- lower risk due to melanin content. advised regular sunscreen use. Denies worrisome, changing, or new skin lesions.  8. Birth control/STD check- currently not preventing pregnancy.  - has not always been regular in 2020 but after baby born 06/07/20 was rather regular- last period end of June or early July- a week or so off.  9. Osteoporosis screening at 32- we will plan on this -never smoker  Status of chronic or acute concerns   #asthma- no recent issues- no meds  #Vitamin D deficiency S: Medication: none at present Last vitamin D Lab Results  Component Value Date   VD25OH 26 05/03/2020  A/P: well controlled last visit- due to potential cost on high deductible plan- opted to restart vitamin D and hold off on labs for now  #screening hyperlipidemia S: Medication:none  Lab Results  Component Value Date   CHOL 191 05/03/2020   HDL 68 05/03/2020   LDLCALC 95 05/03/2020   TRIG 190 (H) 05/03/2020   CHOLHDL 2.8 05/03/2020   A/P: mild triglyceride issues last year but was pregnant. Will rescreen lipids at this point as now not known to be pregnant- screening lipid test  Recommended follow up: Return in about 1 year (around 05/07/2022) for physical or sooner if needed.  Lab/Order associations:NOT fasting   ICD-10-CM   1. Preventative health care  Z00.00 CBC with Differential/Platelet    Comprehensive metabolic panel    Lipid panel    2. Missed period  N92.6  POCT urine pregnancy    3. Screening for hyperlipidemia  Z13.220 Lipid panel    4. Hypertriglyceridemia  E78.1      No orders of the defined types were placed in this encounter.  Return precautions advised.  Tana Conch, MD

## 2021-05-07 NOTE — Patient Instructions (Addendum)
Health Maintenance Due  Topic Date Due   PAP SMEAR- Sign release of information at the check out desk for GYN records for last pap after 2018 12/25/2019   COVID-19 Vaccine (3 - Booster for [employer] series) Will get this scheduled in the fall. Please let us know when you get this.  12/23/2020   INFLUENZA VACCINE Not available in office yet. Please let us know if you get this at a pharmacy.  04/29/2021   Please stop by lab before you go If you have mychart- we will send your results within 3 business days of [place] receiving them.  If you do not have mychart- we will call you about results within 5 business days of [place] receiving them.  *please also note that you will see labs on mychart as soon as they post. I will later go in and write notes on them- will say "notes from Dr. Durene Cal"  If still no period by September reasonable to get rechecked on pregnancy test   Recommended follow up: Return in about 1 year (around 05/07/2022) for physical or sooner if needed.

## 2021-09-12 DIAGNOSIS — Z03818 Encounter for observation for suspected exposure to other biological agents ruled out: Secondary | ICD-10-CM | POA: Diagnosis not present

## 2021-09-12 DIAGNOSIS — Z20822 Contact with and (suspected) exposure to covid-19: Secondary | ICD-10-CM | POA: Diagnosis not present

## 2021-10-16 DIAGNOSIS — Z20822 Contact with and (suspected) exposure to covid-19: Secondary | ICD-10-CM | POA: Diagnosis not present

## 2021-10-16 DIAGNOSIS — Z03818 Encounter for observation for suspected exposure to other biological agents ruled out: Secondary | ICD-10-CM | POA: Diagnosis not present

## 2021-11-22 DIAGNOSIS — Z01419 Encounter for gynecological examination (general) (routine) without abnormal findings: Secondary | ICD-10-CM | POA: Diagnosis not present

## 2021-11-28 LAB — HM PAP SMEAR
HM Pap smear: NEGATIVE
HPV, high-risk: NEGATIVE

## 2022-05-09 ENCOUNTER — Other Ambulatory Visit: Payer: Self-pay | Admitting: Family Medicine

## 2022-05-09 ENCOUNTER — Ambulatory Visit (INDEPENDENT_AMBULATORY_CARE_PROVIDER_SITE_OTHER): Payer: BC Managed Care – PPO | Admitting: Family Medicine

## 2022-05-09 ENCOUNTER — Encounter: Payer: Self-pay | Admitting: Family Medicine

## 2022-05-09 VITALS — BP 100/64 | HR 79 | Temp 97.9°F | Ht 67.0 in | Wt 238.0 lb

## 2022-05-09 DIAGNOSIS — E559 Vitamin D deficiency, unspecified: Secondary | ICD-10-CM

## 2022-05-09 DIAGNOSIS — Z13220 Encounter for screening for lipoid disorders: Secondary | ICD-10-CM

## 2022-05-09 DIAGNOSIS — R635 Abnormal weight gain: Secondary | ICD-10-CM | POA: Diagnosis not present

## 2022-05-09 DIAGNOSIS — R609 Edema, unspecified: Secondary | ICD-10-CM | POA: Diagnosis not present

## 2022-05-09 DIAGNOSIS — Z0000 Encounter for general adult medical examination without abnormal findings: Secondary | ICD-10-CM

## 2022-05-09 DIAGNOSIS — Z130 Encounter for screening for diseases of the blood and blood-forming organs and certain disorders involving the immune mechanism: Secondary | ICD-10-CM | POA: Diagnosis not present

## 2022-05-09 LAB — CBC WITH DIFFERENTIAL/PLATELET
Basophils Absolute: 0 10*3/uL (ref 0.0–0.1)
Basophils Relative: 0.3 % (ref 0.0–3.0)
Eosinophils Absolute: 0.1 10*3/uL (ref 0.0–0.7)
Eosinophils Relative: 1.6 % (ref 0.0–5.0)
HCT: 37.1 % (ref 36.0–46.0)
Hemoglobin: 12.1 g/dL (ref 12.0–15.0)
Lymphocytes Relative: 28.8 % (ref 12.0–46.0)
Lymphs Abs: 2.2 10*3/uL (ref 0.7–4.0)
MCHC: 32.8 g/dL (ref 30.0–36.0)
MCV: 87.3 fl (ref 78.0–100.0)
Monocytes Absolute: 0.5 10*3/uL (ref 0.1–1.0)
Monocytes Relative: 6.1 % (ref 3.0–12.0)
Neutro Abs: 4.7 10*3/uL (ref 1.4–7.7)
Neutrophils Relative %: 63.2 % (ref 43.0–77.0)
Platelets: 326 10*3/uL (ref 150.0–400.0)
RBC: 4.25 Mil/uL (ref 3.87–5.11)
RDW: 14 % (ref 11.5–15.5)
WBC: 7.5 10*3/uL (ref 4.0–10.5)

## 2022-05-09 LAB — POC URINALSYSI DIPSTICK (AUTOMATED)
Bilirubin, UA: NEGATIVE
Blood, UA: NEGATIVE
Glucose, UA: NEGATIVE
Ketones, UA: NEGATIVE
Leukocytes, UA: NEGATIVE
Nitrite, UA: NEGATIVE
Protein, UA: NEGATIVE
Spec Grav, UA: 1.01 (ref 1.010–1.025)
Urobilinogen, UA: 0.2 E.U./dL
pH, UA: 7 (ref 5.0–8.0)

## 2022-05-09 LAB — COMPREHENSIVE METABOLIC PANEL
ALT: 16 U/L (ref 0–35)
AST: 16 U/L (ref 0–37)
Albumin: 4.1 g/dL (ref 3.5–5.2)
Alkaline Phosphatase: 111 U/L (ref 39–117)
BUN: 11 mg/dL (ref 6–23)
CO2: 27 mEq/L (ref 19–32)
Calcium: 9.2 mg/dL (ref 8.4–10.5)
Chloride: 107 mEq/L (ref 96–112)
Creatinine, Ser: 0.85 mg/dL (ref 0.40–1.20)
GFR: 91.14 mL/min (ref >60.00)
Glucose, Bld: 77 mg/dL (ref 70–99)
Potassium: 3.7 mEq/L (ref 3.5–5.1)
Sodium: 137 mEq/L (ref 135–145)
Total Bilirubin: 0.3 mg/dL (ref 0.2–1.2)
Total Protein: 7.6 g/dL (ref 6.0–8.3)

## 2022-05-09 LAB — TSH: TSH: 1.64 u[IU]/mL (ref 0.35–5.50)

## 2022-05-09 LAB — VITAMIN D 25 HYDROXY (VIT D DEFICIENCY, FRACTURES): VITD: 16.49 ng/mL — ABNORMAL LOW (ref 30.00–100.00)

## 2022-05-09 MED ORDER — VITAMIN D (ERGOCALCIFEROL) 1.25 MG (50000 UNIT) PO CAPS: 50000.0000 [IU] | ORAL_CAPSULE | ORAL | 1 refills | Status: DC

## 2022-05-09 NOTE — Patient Instructions (Addendum)
Flu shot- we should have these available within a month or two but please let us know if you get at outside pharmacy  Sign release of information at the check out desk for pap smear  Please stop by lab before you go If you have mychart- we will send your results within 3 business days of [place] receiving them.  If you do not have mychart- we will call you about results within 5 business days of [place] receiving them.  *please also note that you will see labs on mychart as soon as they post. I will later go in and write notes on them- will say "notes from Dr. Durene Cal"   Recommended follow up: Return in about 1 year (around 05/10/2023) for physical or sooner if needed.Schedule b4 you leave.

## 2022-05-09 NOTE — Progress Notes (Signed)
Phone [phone]   Subjective:  Patient presents today for their annual physical. Chief complaint-noted.   See problem oriented charting- ROS- full  review of systems was completed and negative Per full ROS sheet completed by patient- later in visit discovered at least half days of week ankle swelling on both sides  The following were reviewed and entered/updated in epic: Past Medical History:  Diagnosis Date   Asthma    Rhinorrhea    stuffy, runny year round   Patient Active Problem List   Diagnosis Date Noted   Asthma 12/23/2016    Priority: Medium    Vitamin D deficiency 12/23/2016    Priority: Medium    COVID-19 virus infection 06/06/2020    Priority: Low   Allergic rhinitis 01/01/2016    Priority: Low   Past Surgical History:  Procedure Laterality Date   CESAREAN SECTION N/A 07/12/2016   Procedure: CESAREAN SECTION;  Surgeon: Silverio Lay, MD;  Location: [facility] BIRTHING SUITES;  Service: Obstetrics;  Laterality: N/A;   none      Family History  Problem Relation Age of Onset   Heart disease Paternal Grandfather        unknown specific ailment   Hypertension Paternal Grandmother    Diabetes Maternal Grandmother    Hypertension Mother    Sarcoidosis Father    Hypertension Maternal Aunt     Medications- reviewed and updated No current outpatient medications on file.   No current facility-administered medications for this visit.    Allergies-reviewed and updated No Known Allergies  Social History   Social History Narrative   Family: Married. Enriqueta Shutter. 18 months in 12/2017. 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          Hobbies: four wheelers, enjoys travel   Objective  Objective:  BP 100/64   Pulse 79   Temp 97.9 F (36.6 C)   Ht 5\' 7"  (1.702 m)    Wt 238 lb (108 kg)   SpO2 97%   BMI 37.28 kg/m  Gen: NAD, resting comfortably HEENT: Mucous membranes are moist. Oropharynx normal Neck: no thyromegaly CV: RRR no murmurs rubs or gallops Lungs: CTAB no crackles, wheeze, rhonchi Abdomen: soft/nontender/nondistended/normal bowel sounds. No rebound or guarding.  Ext: no edema Skin: warm, dry Neuro: grossly normal, moves all extremities, PERRLA   Assessment and Plan   31 y.o. female presenting for annual physical.  Health Maintenance counseling: 1. Anticipatory guidance: Patient counseled regarding regular dental exams -q6 months, eye exams - yearly,  avoiding smoking and second hand smoke , limiting alcohol to 1 beverage per day- well under , no illicit drugs .   2. Risk factor reduction:  Advised patient of need for regular exercise and diet rich and fruits and vegetables to reduce risk of heart attack and stroke.  Exercise- last year set a goal once or twice a week exercising but recognize multiple challenges on the home front caring for young children- did work with trainer twice a week most of last year then dropped off for a months and restarted 3 months ago- 2 days a week (not going on other days)- mixture of cardio and weight lifting for 45 minutes. Discussed trying to add 60 minutes more per week.  Diet/weight management-weight up 10 pounds from last year- feels  eats reasonably well but hard to lose weight but also cautious about weight gain medications. Weight watchers for 3 weeks worked in past- liked that. Encouraged to retrial Wt Readings from Last 3 Encounters:  05/09/22 238 lb (108 kg)  05/07/21 228 lb 9.6 oz (103.7 kg)  12/04/20 229 lb 4 oz (104 kg)  3. Immunizations/screenings/ancillary studies-will get flu shot in the fall-hold off for now/decline.  Discussed option of COVID-19 vaccination in the fall- has had covid without significant issus in past  Immunization History  Administered Date(s) Administered   HPV Quadrivalent  10/02/2014, 12/01/2014, 04/24/2015   Influenza,inj,Quad PF,6+ Mos 07/13/2017, 09/19/2018   PFIZER(Purple Top)SARS-COV-2 Vaccination 07/04/2020, 07/25/2020   Tdap 04/02/2016, 04/22/2016, 03/16/2020  4. Cervical cancer screening- follows with GYN-we will attempt to get records- last on file 2018  5. Breast cancer screening-  breast exam with GYN and mammogram -possible baseline at 34 and regularly at age 37 yearly 24. Colon cancer screening -  no family history, start at age 33  7. Skin cancer screening-low risk due to melanin content.advised regular sunscreen use. Denies worrisome, changing, or new skin lesions.  8. Birth control/STD check- currently opts to not prevent pregnancy-only active with husband- still does with GYN to be on safe side 9. Osteoporosis screening at 65-we will plan on this at much later date 10. Smoking associated screening -never smoker  Status of chronic or acute concerns   # Asthma-no recent issues even without medication  #allergies- not needing claritin or flonase  #Vitamin D deficiency S: Medication: not taking Last vitamin D Lab Results  Component Value Date   VD25OH 53 05/03/2020  A/P: Due to potential cost on high deductible health plan-last year she decided to restart vitamin D and hold off on labs-this year Is not taking D and wants to update   # Screening hyperlipidemia S: Medication: none Lab Results  Component Value Date   CHOL 170 05/07/2021   HDL 49.10 05/07/2021   LDLCALC 95 05/07/2021   TRIG 131.0 05/07/2021   CHOLHDL 3 05/07/2021   A/P: Cholesterol overall well-controlled-discussed ideal LDL under 70 but certainly not in range to consider medicine- will repeat in next 1-3 years   #possible eczema- has triamcinolone on hand- visit with Jarold Motto, PA  in March  #some edema during the week in particular- can elevate legs or wear compressoin stockings- will check tsh, cmp, cbc, ua  Recommended follow up: Return in about 1 year (around  05/10/2023) for physical or sooner if needed.Schedule b4 you leave.  Lab/Order associations: Not fasting   ICD-10-CM   1. Preventative health care  Z00.00 POCT Urinalysis Dipstick (Automated)    CBC with Differential/Platelet    Comprehensive metabolic panel    2. Weight gain  R63.5 TSH    POCT Urinalysis Dipstick (Automated)    Comprehensive metabolic panel    3. Screening for hyperlipidemia  Z13.220     4. Vitamin D deficiency  E55.9 Vitamin D (25 hydroxy)    5. Screening for deficiency anemia  Z13.0 CBC with Differential/Platelet    6. Edema, unspecified type  R60.9 POCT Urinalysis Dipstick (Automated)    Comprehensive metabolic panel      No orders of the defined types were placed in this encounter.   Return precautions advised.  Tana Conch, MD

## 2022-06-23 DIAGNOSIS — Z1329 Encounter for screening for other suspected endocrine disorder: Secondary | ICD-10-CM | POA: Diagnosis not present

## 2022-06-23 DIAGNOSIS — Z130 Encounter for screening for diseases of the blood and blood-forming organs and certain disorders involving the immune mechanism: Secondary | ICD-10-CM | POA: Diagnosis not present

## 2022-06-23 DIAGNOSIS — Z13220 Encounter for screening for lipoid disorders: Secondary | ICD-10-CM | POA: Diagnosis not present

## 2022-06-23 DIAGNOSIS — Z131 Encounter for screening for diabetes mellitus: Secondary | ICD-10-CM | POA: Diagnosis not present

## 2022-06-23 DIAGNOSIS — N951 Menopausal and female climacteric states: Secondary | ICD-10-CM | POA: Diagnosis not present

## 2022-06-23 DIAGNOSIS — R635 Abnormal weight gain: Secondary | ICD-10-CM | POA: Diagnosis not present

## 2022-06-23 DIAGNOSIS — E559 Vitamin D deficiency, unspecified: Secondary | ICD-10-CM | POA: Diagnosis not present

## 2022-06-26 DIAGNOSIS — E559 Vitamin D deficiency, unspecified: Secondary | ICD-10-CM | POA: Diagnosis not present

## 2022-06-26 DIAGNOSIS — Z1339 Encounter for screening examination for other mental health and behavioral disorders: Secondary | ICD-10-CM | POA: Diagnosis not present

## 2022-06-26 DIAGNOSIS — Z1331 Encounter for screening for depression: Secondary | ICD-10-CM | POA: Diagnosis not present

## 2022-06-26 DIAGNOSIS — R635 Abnormal weight gain: Secondary | ICD-10-CM | POA: Diagnosis not present

## 2022-07-03 DIAGNOSIS — E669 Obesity, unspecified: Secondary | ICD-10-CM | POA: Diagnosis not present

## 2022-07-03 DIAGNOSIS — E559 Vitamin D deficiency, unspecified: Secondary | ICD-10-CM | POA: Diagnosis not present

## 2022-07-10 DIAGNOSIS — E663 Overweight: Secondary | ICD-10-CM | POA: Diagnosis not present

## 2022-07-10 DIAGNOSIS — E559 Vitamin D deficiency, unspecified: Secondary | ICD-10-CM | POA: Diagnosis not present

## 2022-07-17 DIAGNOSIS — E663 Overweight: Secondary | ICD-10-CM | POA: Diagnosis not present

## 2022-07-17 DIAGNOSIS — E559 Vitamin D deficiency, unspecified: Secondary | ICD-10-CM | POA: Diagnosis not present

## 2022-08-06 DIAGNOSIS — E669 Obesity, unspecified: Secondary | ICD-10-CM | POA: Diagnosis not present

## 2022-08-06 DIAGNOSIS — E559 Vitamin D deficiency, unspecified: Secondary | ICD-10-CM | POA: Diagnosis not present

## 2022-08-15 DIAGNOSIS — E669 Obesity, unspecified: Secondary | ICD-10-CM | POA: Diagnosis not present

## 2022-08-15 DIAGNOSIS — E559 Vitamin D deficiency, unspecified: Secondary | ICD-10-CM | POA: Diagnosis not present

## 2022-08-20 DIAGNOSIS — Z113 Encounter for screening for infections with a predominantly sexual mode of transmission: Secondary | ICD-10-CM | POA: Diagnosis not present

## 2022-08-20 DIAGNOSIS — N939 Abnormal uterine and vaginal bleeding, unspecified: Secondary | ICD-10-CM | POA: Diagnosis not present

## 2022-08-26 ENCOUNTER — Other Ambulatory Visit: Payer: Self-pay | Admitting: Obstetrics and Gynecology

## 2022-08-26 DIAGNOSIS — N920 Excessive and frequent menstruation with regular cycle: Secondary | ICD-10-CM

## 2022-08-27 ENCOUNTER — Encounter: Payer: Self-pay | Admitting: Obstetrics and Gynecology

## 2022-08-29 DIAGNOSIS — E559 Vitamin D deficiency, unspecified: Secondary | ICD-10-CM | POA: Diagnosis not present

## 2022-08-29 DIAGNOSIS — N921 Excessive and frequent menstruation with irregular cycle: Secondary | ICD-10-CM | POA: Diagnosis not present

## 2022-08-29 DIAGNOSIS — Z6835 Body mass index (BMI) 35.0-35.9, adult: Secondary | ICD-10-CM | POA: Diagnosis not present

## 2022-09-04 ENCOUNTER — Other Ambulatory Visit: Payer: BC Managed Care – PPO

## 2022-09-10 ENCOUNTER — Ambulatory Visit
Admission: RE | Admit: 2022-09-10 | Discharge: 2022-09-10 | Disposition: A | Discharge status: Home / Self Care | Payer: BC Managed Care – PPO | Source: Ambulatory Visit | Attending: Obstetrics and Gynecology | Admitting: Obstetrics and Gynecology

## 2022-09-10 DIAGNOSIS — N920 Excessive and frequent menstruation with regular cycle: Secondary | ICD-10-CM

## 2022-09-16 ENCOUNTER — Ambulatory Visit
Admission: RE | Admit: 2022-09-16 | Discharge: 2022-09-16 | Disposition: A | Discharge status: Home / Self Care | Payer: BC Managed Care – PPO | Source: Ambulatory Visit | Attending: Obstetrics and Gynecology | Admitting: Obstetrics and Gynecology

## 2023-01-28 DIAGNOSIS — Z01419 Encounter for gynecological examination (general) (routine) without abnormal findings: Secondary | ICD-10-CM | POA: Diagnosis not present

## 2023-01-28 DIAGNOSIS — N920 Excessive and frequent menstruation with regular cycle: Secondary | ICD-10-CM | POA: Diagnosis not present

## 2023-01-28 DIAGNOSIS — Z113 Encounter for screening for infections with a predominantly sexual mode of transmission: Secondary | ICD-10-CM | POA: Diagnosis not present

## 2023-01-28 DIAGNOSIS — Z9889 Other specified postprocedural states: Secondary | ICD-10-CM | POA: Diagnosis not present

## 2023-01-28 DIAGNOSIS — N8003 Adenomyosis of the uterus: Secondary | ICD-10-CM | POA: Diagnosis not present

## 2023-04-29 ENCOUNTER — Encounter (INDEPENDENT_AMBULATORY_CARE_PROVIDER_SITE_OTHER): Payer: Self-pay

## 2023-05-12 ENCOUNTER — Encounter: Payer: Self-pay | Admitting: Family Medicine

## 2023-05-12 ENCOUNTER — Other Ambulatory Visit: Payer: Self-pay | Admitting: Family Medicine

## 2023-05-12 ENCOUNTER — Ambulatory Visit (INDEPENDENT_AMBULATORY_CARE_PROVIDER_SITE_OTHER): Payer: BC Managed Care – PPO | Admitting: Family Medicine

## 2023-05-12 VITALS — BP 102/70 | HR 77 | Temp 98.2°F | Ht 67.0 in | Wt 237.2 lb

## 2023-05-12 DIAGNOSIS — Z131 Encounter for screening for diabetes mellitus: Secondary | ICD-10-CM

## 2023-05-12 DIAGNOSIS — E559 Vitamin D deficiency, unspecified: Secondary | ICD-10-CM | POA: Diagnosis not present

## 2023-05-12 DIAGNOSIS — Z13220 Encounter for screening for lipoid disorders: Secondary | ICD-10-CM

## 2023-05-12 DIAGNOSIS — E669 Obesity, unspecified: Secondary | ICD-10-CM

## 2023-05-12 DIAGNOSIS — R609 Edema, unspecified: Secondary | ICD-10-CM

## 2023-05-12 DIAGNOSIS — Z0000 Encounter for general adult medical examination without abnormal findings: Secondary | ICD-10-CM | POA: Diagnosis not present

## 2023-05-12 DIAGNOSIS — Z130 Encounter for screening for diseases of the blood and blood-forming organs and certain disorders involving the immune mechanism: Secondary | ICD-10-CM

## 2023-05-12 LAB — LIPID PANEL
Cholesterol: 172 mg/dL (ref 0–200)
HDL: 47.6 mg/dL (ref >39.00)
LDL Cholesterol: 108 mg/dL — ABNORMAL HIGH (ref 0–99)
NonHDL: 124.16
Total CHOL/HDL Ratio: 4
Triglycerides: 83 mg/dL (ref 0.0–149.0)
VLDL: 16.6 mg/dL (ref 0.0–40.0)

## 2023-05-12 LAB — CBC WITH DIFFERENTIAL/PLATELET
Basophils Absolute: 0 10*3/uL (ref 0.0–0.1)
Basophils Relative: 0.2 % (ref 0.0–3.0)
Eosinophils Absolute: 0.1 10*3/uL (ref 0.0–0.7)
Eosinophils Relative: 1.6 % (ref 0.0–5.0)
HCT: 38.2 % (ref 36.0–46.0)
Hemoglobin: 12.2 g/dL (ref 12.0–15.0)
Lymphocytes Relative: 30.9 % (ref 12.0–46.0)
Lymphs Abs: 1.7 10*3/uL (ref 0.7–4.0)
MCHC: 31.9 g/dL (ref 30.0–36.0)
MCV: 86 fl (ref 78.0–100.0)
Monocytes Absolute: 0.3 10*3/uL (ref 0.1–1.0)
Monocytes Relative: 6.1 % (ref 3.0–12.0)
Neutro Abs: 3.5 10*3/uL (ref 1.4–7.7)
Neutrophils Relative %: 61.2 % (ref 43.0–77.0)
Platelets: 339 10*3/uL (ref 150.0–400.0)
RBC: 4.44 Mil/uL (ref 3.87–5.11)
RDW: 15.4 % (ref 11.5–15.5)
WBC: 5.6 10*3/uL (ref 4.0–10.5)

## 2023-05-12 LAB — URINALYSIS, ROUTINE W REFLEX MICROSCOPIC
Bilirubin Urine: NEGATIVE
Hgb urine dipstick: NEGATIVE
Ketones, ur: NEGATIVE
Nitrite: NEGATIVE
Specific Gravity, Urine: 1.02 (ref 1.000–1.030)
Total Protein, Urine: NEGATIVE
Urine Glucose: NEGATIVE
Urobilinogen, UA: 0.2 (ref 0.0–1.0)
pH: 6.5 (ref 5.0–8.0)

## 2023-05-12 LAB — HEMOGLOBIN A1C: Hgb A1c MFr Bld: 5.5 % (ref 4.6–6.5)

## 2023-05-12 LAB — COMPREHENSIVE METABOLIC PANEL
ALT: 16 U/L (ref 0–35)
AST: 14 U/L (ref 0–37)
Albumin: 4.2 g/dL (ref 3.5–5.2)
Alkaline Phosphatase: 113 U/L (ref 39–117)
BUN: 11 mg/dL (ref 6–23)
CO2: 27 mEq/L (ref 19–32)
Calcium: 9.4 mg/dL (ref 8.4–10.5)
Chloride: 104 mEq/L (ref 96–112)
Creatinine, Ser: 0.81 mg/dL (ref 0.40–1.20)
GFR: 95.89 mL/min (ref >60.00)
Glucose, Bld: 83 mg/dL (ref 70–99)
Potassium: 4 mEq/L (ref 3.5–5.1)
Sodium: 137 mEq/L (ref 135–145)
Total Bilirubin: 0.3 mg/dL (ref 0.2–1.2)
Total Protein: 7.8 g/dL (ref 6.0–8.3)

## 2023-05-12 LAB — VITAMIN D 25 HYDROXY (VIT D DEFICIENCY, FRACTURES): VITD: 20.38 ng/mL — ABNORMAL LOW (ref 30.00–100.00)

## 2023-05-12 MED ORDER — VITAMIN D (ERGOCALCIFEROL) 1.25 MG (50000 UNIT) PO CAPS: 50000.0000 [IU] | ORAL_CAPSULE | ORAL | 1 refills | Status: AC

## 2023-05-12 NOTE — Progress Notes (Signed)
Phone [phone]   Subjective:  Patient presents today for their annual physical. Chief complaint-noted.   See problem oriented charting- ROS- full  review of systems was completed and negative Per full ROS sheet completed by patient  The following were reviewed and entered/updated in epic: Past Medical History:  Diagnosis Date   Asthma    Rhinorrhea    stuffy, runny year round   Patient Active Problem List   Diagnosis Date Noted   Asthma 12/23/2016    Priority: Medium    Vitamin D deficiency 12/23/2016    Priority: Medium    COVID-19 virus infection 06/06/2020    Priority: Low   Allergic rhinitis 01/01/2016    Priority: Low   Past Surgical History:  Procedure Laterality Date   CESAREAN SECTION N/A 07/12/2016   Procedure: CESAREAN SECTION;  Surgeon: Silverio Lay, MD;  Location: [facility] BIRTHING SUITES;  Service: Obstetrics;  Laterality: N/A;   none      Family History  Problem Relation Age of Onset   Hypertension Mother    Sarcoidosis Father    Heart disease Paternal Grandfather        unknown specific ailment   Hypertension Paternal Grandmother    Diabetes Maternal Grandmother    Hypertension Maternal Aunt     Medications- reviewed and updated Current Outpatient Medications  Medication Sig Dispense Refill   Vitamin D, Ergocalciferol, (DRISDOL) 1.25 MG (50000 UNIT) CAPS capsule Take 1 capsule (50,000 Units total) by mouth every 7 (seven) days. 13 capsule 1   No current facility-administered medications for this visit.    Allergies-reviewed and updated No Known Allergies  Social History   Social History Narrative   Family: Married. Enriqueta Shutter. 18 months in 12/2017. 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          Hobbies: four wheelers, enjoys travel    Objective  Objective:  BP 102/70   Pulse 77   Temp 98.2 F (36.8 C)   Ht 5\' 7"  (1.702 m)   Wt 237 lb 3.2 oz (107.6 kg)   SpO2 95%   BMI 37.15 kg/m  Gen: NAD, resting comfortably HEENT: Mucous membranes are moist. Oropharynx normal Neck: no thyromegaly CV: RRR no murmurs rubs or gallops Lungs: CTAB no crackles, wheeze, rhonchi Abdomen: soft/nontender/nondistended/normal bowel sounds. No rebound or guarding.  Ext: no edema Skin: warm, dry Neuro: grossly normal, moves all extremities, PERRLA   Assessment and Plan   32 y.o. female presenting for annual physical.  Health Maintenance counseling: 1. Anticipatory guidance: Patient counseled regarding regular dental exams -q6 months, eye exams - yearly,  avoiding smoking and second hand smoke , limiting alcohol to 1 beverage per day- well under , no illicit drugs .   2. Risk factor reduction:  Advised patient of need for regular exercise and diet rich and fruits and vegetables to reduce risk of heart attack and stroke.  Exercise- working with trainer last year 2 days a week- not with trainer anymore.  Diet/weight management-weight down 1 pounds from last year- has considered seeing nutritionist. Weight watchers some benefit in past- declines healthy weight to wellness.  Wt Readings from Last 3 Encounters:  05/12/23 237 lb 3.2 oz (107.6 kg)  05/09/22 238 lb (108 kg)  05/07/21 228 lb 9.6 oz (103.7 kg)  3. Immunizations/screenings/ancillary studies- will get fall flu shot but hold for now- consider fall COVID shot Immunization History  Administered Date(s) Administered   HPV Quadrivalent 10/02/2014, 12/01/2014, 04/24/2015   Influenza,inj,Quad PF,6+ Mos 07/13/2017, 09/19/2018   PFIZER(Purple Top)SARS-COV-2 Vaccination 07/04/2020, 07/25/2020   Tdap 04/02/2016, 04/22/2016, 03/16/2020  4. Cervical cancer screening- follows with GYN-we will attempt to get records (last on file 2018) again this year- tried last year without success 5.  Breast cancer screening-  breast exam with GYN and mammogram -possible baseline at 34 and regularly at age 73 yearly 52. Colon cancer screening -  no family history, start at age 68 . She may check with mom- may have had polyps- but not sure of age 20. Skin cancer screening-low risk due to melanin content.advised regular sunscreen use. Denies worrisome, changing, or new skin lesions.  8. Birth control/STD check- currently opts to not prevent pregnancy-only active with husband- condoms or nothing- ok if happened to get pregnant- still does with obstetrics/GYN to be on safe side 9. Osteoporosis screening at 65-we will plan on this at much later date 10. Smoking associated screening -never smoker  Status of chronic or acute concerns   #Feeling off over last 2-3 weeks- Sunday mild lightheadedness. Checked heart rate on watch and normal. Saturday night left arm was aching some but better next day - was not worth with exertion and no shortness of breath with it. Doesn't always feel well hydrated. Did do home pregnancy and negative  #asthma/allergies- doing well lately without medications   #Vitamin D deficiency S: Medication: low last year and gave high dose for 26 weeks- currently off  Last vitamin D Lab Results  Component Value Date   VD25OH 16.49 (L) 05/09/2022  A/P: hopefully stable- update vitamin D today.   #screening hyperlipidemia- monitor with labs- certainly not in range for medications  Lab Results  Component Value Date   CHOL 170 05/07/2021   HDL 49.10 05/07/2021   LDLCALC 95 05/07/2021   TRIG 131.0 05/07/2021   CHOLHDL 3 05/07/2021   #possible eczema- keeps triamcinolone on hand - no recnet issues  #edema during week- elevate legs or compression helps- TSH, CBC, CMP, urinalysis planned but urinalysis didn't go through so try again today  Recommended follow up: Return in about 1 year (around 05/11/2024) for physical or sooner if needed.Schedule b4 you leave.  Lab/Order  associations: fasting   ICD-10-CM   1. Preventative health care  Z00.00     2. Screening for diabetes mellitus  Z13.1     3. Screening for hyperlipidemia  Z13.220     4. Obesity (BMI 30-39.9)  E66.9     5. Vitamin D deficiency  E55.9       No orders of the defined types were placed in this encounter.   Return precautions advised.  Tana Conch, MD

## 2023-05-12 NOTE — Patient Instructions (Addendum)
Let us know if you get a flu or COVID vaccine this fall.  Take at least 1000 units of vitamin D- your multivitamin may have this but particularly check if levels still low  Please stop by lab before you go If you have mychart- we will send your results within 3 business days of [place] receiving them.  If you do not have mychart- we will call you about results within 5 business days of [place] receiving them.  *please also note that you will see labs on mychart as soon as they post. I will later go in and write notes on them- will say "notes from Dr. Durene Cal"   We have placed a referral for you today to nutrition (check with them on cost). In some cases you will see # listed below- you can call this if you have not heard within a week. If you do not see # listed- you should receive a mychart message or phone call within a week with the # to call directly- call that as soon as you get it. If you are having issues getting scheduled reach out to [place] again.   Recommended follow up: Return in about 1 year (around 05/11/2024) for physical or sooner if needed.Schedule b4 you leave.

## 2023-05-13 ENCOUNTER — Other Ambulatory Visit: Payer: Self-pay

## 2023-05-13 DIAGNOSIS — R82998 Other abnormal findings in urine: Secondary | ICD-10-CM

## 2023-05-14 ENCOUNTER — Other Ambulatory Visit: Payer: BC Managed Care – PPO

## 2023-05-14 DIAGNOSIS — R82998 Other abnormal findings in urine: Secondary | ICD-10-CM

## 2023-05-15 LAB — URINE CULTURE
MICRO NUMBER:: 15336078
SPECIMEN QUALITY:: ADEQUATE

## 2023-06-24 ENCOUNTER — Encounter: Payer: BC Managed Care – PPO | Attending: Family Medicine | Admitting: Registered"

## 2023-06-24 ENCOUNTER — Encounter: Payer: Self-pay | Admitting: Registered"

## 2023-06-24 DIAGNOSIS — Z6830 Body mass index (BMI) 30.0-30.9, adult: Secondary | ICD-10-CM | POA: Insufficient documentation

## 2023-06-24 DIAGNOSIS — Z713 Dietary counseling and surveillance: Secondary | ICD-10-CM | POA: Insufficient documentation

## 2023-06-24 DIAGNOSIS — E669 Obesity, unspecified: Secondary | ICD-10-CM | POA: Insufficient documentation

## 2023-06-24 NOTE — Progress Notes (Signed)
Medical Nutrition Therapy  Appointment Start time:  8:14  Appointment End time:  9:20  Primary concerns today: more knowledge, direction of how to hit goal of losing weight  Referral diagnosis: obesity Preferred learning style: no preference indicated Learning readiness: ready, change in progress   NUTRITION ASSESSMENT   States she wants to lose weight. States her provider doesn't want her weight to become an issue as she becomes older.  States she doesn't want to take any medications but also wants to lose weight. States she doesn't want to do a quick fiz and wants long-term stability.   States she tried to go to [facility]. States she liked their program except for calorie counting. States she prefers having a list of foods to eat. States she attended for about 6 months and also participated in Weight [employer] for 3 months. States consistency is an issue. States she also did personal training for 2 years and did not lose any weight; mostly strength training.   States she likes a lot of sweets, sweet tea, coffees (with creamer). Prefers protein and veggies although she likes starches. 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    Clinical Medical Hx: PCOS  Medications: See list Labs:  low Vit D (20.38), elevated LDL (108) Notable Signs/Symptoms: none reported  Lifestyle & Dietary Hx  Estimated daily fluid intake:  oz Supplements: See list  Sleep: 6-7 hrs/night; will sometimes stay up late to watch tv to rest her mind Stress / self-care: walking during lunch  Current average weekly physical activity: was walking 20-30 min  24-Hr Dietary Recall First Meal (7:45 am): [employer]-iced coffee vanilla sweet cream with sweet cream foam + 1/2 ham and swiss croissant Snack:  Second Meal (1 pm): [employer] - salmon + pasta + vegetables + bread pudding + water Snack (4 pm): Boba tea Third Meal (8 pm): McDonald's-quarter pounder  with cheese + fries + water Snack:  Beverages: ice coffee, Boba tea, water   NUTRITION DIAGNOSIS  NB-1.1 Food and nutrition-related knowledge deficit As related to having balanced meals.  As evidenced by pt reports of 24 hour recall.   NUTRITION INTERVENTION  Nutrition education (E-1) on the following topics: Nutrition education and counseling. Pt was educated on the benefits of eating a variety of food groups at each meal. Discussed the purpose of each food group and ways to create balance with already established regimen. Discussed eating every 3-5 hours to help adequately nourish body and ways to increase water intake. Discussed importance of physical activity. Pt agreed with goals listed.   Handouts Provided Include  Start Simple with MyPlate  Learning Style & Readiness for Change Teaching method utilized: Visual & Auditory  Demonstrated degree of understanding via: Teach Back  Barriers to learning/adherence to lifestyle change: work-life balance  Goals Established by Pt Aim to improve bedtime routine.  Minimize screen time/electronic use at least 30 minutes for bedtime.  Limit caffeine intake hours before bed.  Aim to have balanced snack options at office such as:  Peanut butter crackers Cheese and crackers Trail mix Greek yogurt Aim to have at least 3 food groups with each meal. Include vegetables, carbohydrates, and protein.   Aim to resume walking regimen.    MONITORING & EVALUATION Dietary intake, weekly physical activity.  Next Steps  Patient is to follow-up prn.

## 2023-06-24 NOTE — Patient Instructions (Addendum)
-   Aim to improve bedtime routine.  Minimize screen time/electronic use at least 30 minutes for bedtime.  Limit caffeine intake hours before bed.   - Aim to have balanced snack options at office such as:  Peanut butter crackers Cheese and crackers Trail mix Greek yogurt  - Aim to have at least 3 food groups with each meal. Include vegetables, carbohydrates, and protein.    - Aim to resume walking regimen.

## 2023-09-10 DIAGNOSIS — N912 Amenorrhea, unspecified: Secondary | ICD-10-CM | POA: Diagnosis not present

## 2023-09-10 DIAGNOSIS — O3680X9 Pregnancy with inconclusive fetal viability, other fetus: Secondary | ICD-10-CM | POA: Diagnosis not present

## 2023-09-30 NOTE — L&D Delivery Note (Signed)
 Delivery Note:   H5E7987 at [redacted]w[redacted]d  Admitting diagnosis: Encounter for induction of labor [Z34.90] Risks: BMI >40 Onset of labor: 04/19/2024 at 1338 IOL/Augmentation: AROM and Pitocin  ROM: 04/19/2024 at 1338, clear fluid  Complete dilation at 04/20/2024 0615 Onset of pushing at 0630 FHR second stage Cat II, variables with pushing, terminal bradycardia  Analgesia/Anesthesia intrapartum:Epidural Pushing in lithotomy position with CNM and L&D staff support. Partner, Toribio, present for birth and supportive.  Delivery of a Live born female  Birth Weight:  pending APGAR: 8, 9  Newborn Delivery   Birth date/time: 04/20/2024 06:36:00 Delivery type: VBAC, Spontaneous    in cephalic presentation, position OP to LOP.  APGAR:1 min-8 , 5 min-9   Nuchal Cord: Yes  x 1, loose Cord double clamped after cessation of pulsation, cut by Toribio.  Collection of cord blood for typing completed. Arterial cord blood sample-No   Placenta delivered-Spontaneous with 3 vessels. Uterotonics: Pitocin  Placenta to L&D Uterine tone firm  Bleeding scant  2nd degree laceration identified.  Episiotomy:None Local analgesia: N/A  Repair: 2-0 in usual fashion Est. Blood Loss (mL):150.00  Complications: None  Mom to postpartum. Ezella Serge to Couplet care / Skin to Skin.  Delivery Report:   Review the Delivery Report for details.    Alan MARLA Molt, CNM, MSN 04/20/2024, 7:01 AM

## 2023-10-05 ENCOUNTER — Encounter: Payer: Self-pay | Admitting: Family

## 2023-10-05 ENCOUNTER — Ambulatory Visit: Payer: BC Managed Care – PPO | Admitting: Family

## 2023-10-05 VITALS — BP 126/80 | HR 88 | Temp 97.8°F | Ht 67.0 in | Wt 246.4 lb

## 2023-10-05 DIAGNOSIS — R0981 Nasal congestion: Secondary | ICD-10-CM

## 2023-10-05 DIAGNOSIS — O99891 Other specified diseases and conditions complicating pregnancy: Secondary | ICD-10-CM

## 2023-10-05 DIAGNOSIS — Z3A12 12 weeks gestation of pregnancy: Secondary | ICD-10-CM | POA: Diagnosis not present

## 2023-10-05 MED ORDER — TRIAMCINOLONE ACETONIDE 55 MCG/ACT NA AERO: 1.0000 | INHALATION_SPRAY | Freq: Every day | NASAL | 2 refills | Status: DC

## 2023-10-05 NOTE — Progress Notes (Signed)
 Patient ID: Cheryl Curtis, female    DOB: 1991/08/17, 33 y.o.   MRN: [id number]  Chief Complaint  Patient presents with   Sinus Problem    Pt c/o Nasal congestion, Present for 2 month. Worsens at night and has to breathe out of mouth. Pt is [redacted] weeks pregnant.        Discussed the use of AI scribe software for clinical note transcription with the patient, who gave verbal consent to proceed.  History of Present Illness   The patient, who is currently 12w pregnant, presents with persistent nasal congestion. The congestion is bilateral and is severe enough to disrupt her sleep due to dry mouth and near nosebleeds. The patient reports that the congestion is present throughout the day and is not relieved by changes in environment, as she has experienced the same symptoms at her home, her mother's house, and her vacation home. The patient has not noticed any significant nasal discharge, and the mucus appears clear when she does need to blow her nose. The patient has not experienced any previous sinus issues, but does mention a past intermittent issue with her nose. She has tried OTC antihistamine intermittently but without much relief.     Assessment & Plan:     Nasal Congestion - Persistent bilateral nasal congestion, worse at night, causing sleep disturbances. Clear nasal discharge. No relief with change in environment. No current use of over-the-counter medications. -Prescribing generic Nasacort  (steroid nasal spray), one squirt each side twice a day for a few days, then daily until symptoms improve. -Advise use of saline nasal spray or neti pot for additional relief and to prevent infection several times per day. -Advise patient to monitor for changes in mucus color or consistency, indicating possible infection. -Follow-up as needed for symptom management.     Subjective:    Outpatient Medications Prior to Visit  Medication Sig Dispense Refill   Vitamin D , Ergocalciferol , (DRISDOL ) 1.25 MG  (50000 UNIT) CAPS capsule Take 1 capsule (50,000 Units total) by mouth every 7 (seven) days. 13 capsule 1   cholecalciferol  (VITAMIN D3) 25 MCG (1000 UNIT) tablet Take 1,000 Units by mouth daily. (Patient not taking: Reported on 10/05/2023)     No facility-administered medications prior to visit.   Past Medical History:  Diagnosis Date   Asthma    Rhinorrhea    stuffy, runny year round   Past Surgical History:  Procedure Laterality Date   CESAREAN SECTION N/A 07/12/2016   Procedure: CESAREAN SECTION;  Surgeon: Nena App, MD;  Location: [facility] BIRTHING SUITES;  Service: Obstetrics;  Laterality: N/A;   none     No Known Allergies    Objective:    Physical Exam Vitals and nursing note reviewed.  Constitutional:      Appearance: Normal appearance.  HENT:     Right Ear: Tympanic membrane and ear canal normal.     Left Ear: Tympanic membrane and ear canal normal.     Nose: Mucosal edema, congestion (left > right) and rhinorrhea present. Rhinorrhea is clear.     Right Nostril: No epistaxis, septal hematoma or occlusion.     Left Nostril: No epistaxis, septal hematoma or occlusion.     Mouth/Throat:     Mouth: Mucous membranes are moist.     Pharynx: No pharyngeal swelling, oropharyngeal exudate, posterior oropharyngeal erythema, uvula swelling or postnasal drip.  Cardiovascular:     Rate and Rhythm: Normal rate and regular rhythm.  Pulmonary:     Effort: Pulmonary effort  is normal.     Breath sounds: Normal breath sounds.  Musculoskeletal:        General: Normal range of motion.  Lymphadenopathy:     Head:     Right side of head: No submandibular, tonsillar or preauricular adenopathy.     Left side of head: No submandibular, tonsillar or preauricular adenopathy.     Cervical: No cervical adenopathy.  Skin:    General: Skin is warm and dry.  Neurological:     Mental Status: She is alert.  Psychiatric:        Mood and Affect: Mood normal.        Behavior: Behavior normal.     BP 126/80 (BP Location: Left Arm, Patient Position: Sitting, Cuff Size: Normal)   Pulse 88   Temp 97.8 F (36.6 C) (Temporal)   Ht 5' 7 (1.702 m)   Wt 246 lb 6 oz (111.8 kg)   SpO2 97%   BMI 38.59 kg/m  Wt Readings from Last 3 Encounters:  10/05/23 246 lb 6 oz (111.8 kg)  05/12/23 237 lb 3.2 oz (107.6 kg)  05/09/22 238 lb (108 kg)       Lucius Krabbe, NP

## 2023-10-14 DIAGNOSIS — Z369 Encounter for antenatal screening, unspecified: Secondary | ICD-10-CM | POA: Diagnosis not present

## 2023-10-14 DIAGNOSIS — Z3482 Encounter for supervision of other normal pregnancy, second trimester: Secondary | ICD-10-CM | POA: Diagnosis not present

## 2023-10-14 DIAGNOSIS — O99211 Obesity complicating pregnancy, first trimester: Secondary | ICD-10-CM | POA: Diagnosis not present

## 2023-10-14 DIAGNOSIS — Z9889 Other specified postprocedural states: Secondary | ICD-10-CM | POA: Diagnosis not present

## 2023-10-14 DIAGNOSIS — J45909 Unspecified asthma, uncomplicated: Secondary | ICD-10-CM | POA: Diagnosis not present

## 2023-10-14 LAB — OB RESULTS CONSOLE HEPATITIS B SURFACE ANTIGEN: Hepatitis B Surface Ag: NEGATIVE

## 2023-10-14 LAB — HEPATITIS C ANTIBODY: HCV Ab: NEGATIVE

## 2023-10-14 LAB — OB RESULTS CONSOLE RUBELLA ANTIBODY, IGM: Rubella: IMMUNE

## 2023-10-14 LAB — OB RESULTS CONSOLE GC/CHLAMYDIA
Chlamydia: NEGATIVE
Neisseria Gonorrhea: NEGATIVE

## 2023-10-14 LAB — OB RESULTS CONSOLE RPR: RPR: NONREACTIVE

## 2023-10-14 LAB — OB RESULTS CONSOLE HIV ANTIBODY (ROUTINE TESTING): HIV: NONREACTIVE

## 2023-10-14 LAB — OB RESULTS CONSOLE ANTIBODY SCREEN: Antibody Screen: NEGATIVE

## 2023-12-09 DIAGNOSIS — Z363 Encounter for antenatal screening for malformations: Secondary | ICD-10-CM | POA: Diagnosis not present

## 2023-12-09 DIAGNOSIS — Z3A21 21 weeks gestation of pregnancy: Secondary | ICD-10-CM | POA: Diagnosis not present

## 2023-12-24 DIAGNOSIS — Z3686 Encounter for antenatal screening for cervical length: Secondary | ICD-10-CM | POA: Diagnosis not present

## 2023-12-24 DIAGNOSIS — Z3A23 23 weeks gestation of pregnancy: Secondary | ICD-10-CM | POA: Diagnosis not present

## 2024-01-21 DIAGNOSIS — Z369 Encounter for antenatal screening, unspecified: Secondary | ICD-10-CM | POA: Diagnosis not present

## 2024-01-21 DIAGNOSIS — Z3A27 27 weeks gestation of pregnancy: Secondary | ICD-10-CM | POA: Diagnosis not present

## 2024-01-21 DIAGNOSIS — O26873 Cervical shortening, third trimester: Secondary | ICD-10-CM | POA: Diagnosis not present

## 2024-03-03 DIAGNOSIS — Z3A33 33 weeks gestation of pregnancy: Secondary | ICD-10-CM | POA: Diagnosis not present

## 2024-03-03 DIAGNOSIS — Z364 Encounter for antenatal screening for fetal growth retardation: Secondary | ICD-10-CM | POA: Diagnosis not present

## 2024-03-30 DIAGNOSIS — O99211 Obesity complicating pregnancy, first trimester: Secondary | ICD-10-CM | POA: Diagnosis not present

## 2024-03-30 DIAGNOSIS — Z364 Encounter for antenatal screening for fetal growth retardation: Secondary | ICD-10-CM | POA: Diagnosis not present

## 2024-03-30 DIAGNOSIS — Z3A36 36 weeks gestation of pregnancy: Secondary | ICD-10-CM | POA: Diagnosis not present

## 2024-03-30 DIAGNOSIS — Z3493 Encounter for supervision of normal pregnancy, unspecified, third trimester: Secondary | ICD-10-CM | POA: Diagnosis not present

## 2024-04-06 DIAGNOSIS — O99211 Obesity complicating pregnancy, first trimester: Secondary | ICD-10-CM | POA: Diagnosis not present

## 2024-04-06 DIAGNOSIS — Z331 Pregnant state, incidental: Secondary | ICD-10-CM | POA: Diagnosis not present

## 2024-04-06 DIAGNOSIS — Z9889 Other specified postprocedural states: Secondary | ICD-10-CM | POA: Diagnosis not present

## 2024-04-06 DIAGNOSIS — Z3A37 37 weeks gestation of pregnancy: Secondary | ICD-10-CM | POA: Diagnosis not present

## 2024-04-06 DIAGNOSIS — O26873 Cervical shortening, third trimester: Secondary | ICD-10-CM | POA: Diagnosis not present

## 2024-04-06 DIAGNOSIS — J45909 Unspecified asthma, uncomplicated: Secondary | ICD-10-CM | POA: Diagnosis not present

## 2024-04-13 DIAGNOSIS — Z3A39 39 weeks gestation of pregnancy: Secondary | ICD-10-CM | POA: Diagnosis not present

## 2024-04-13 DIAGNOSIS — Z364 Encounter for antenatal screening for fetal growth retardation: Secondary | ICD-10-CM | POA: Diagnosis not present

## 2024-04-14 ENCOUNTER — Other Ambulatory Visit: Payer: Self-pay | Admitting: Obstetrics and Gynecology

## 2024-04-15 ENCOUNTER — Telehealth (HOSPITAL_COMMUNITY): Payer: Self-pay | Admitting: *Deleted

## 2024-04-15 ENCOUNTER — Encounter (HOSPITAL_COMMUNITY): Payer: Self-pay | Admitting: *Deleted

## 2024-04-15 NOTE — Telephone Encounter (Signed)
 Preadmission screen

## 2024-04-18 ENCOUNTER — Inpatient Hospital Stay (HOSPITAL_COMMUNITY)

## 2024-04-19 ENCOUNTER — Inpatient Hospital Stay (HOSPITAL_COMMUNITY): Admitting: Anesthesiology

## 2024-04-19 ENCOUNTER — Encounter (HOSPITAL_COMMUNITY): Payer: Self-pay | Admitting: Family Medicine

## 2024-04-19 ENCOUNTER — Other Ambulatory Visit: Payer: Self-pay

## 2024-04-19 ENCOUNTER — Inpatient Hospital Stay (HOSPITAL_COMMUNITY)
Admission: AD | Admit: 2024-04-19 | Discharge: 2024-04-21 | DRG: 806 | Disposition: A | Discharge status: Home / Self Care | Attending: Obstetrics and Gynecology | Admitting: Obstetrics and Gynecology

## 2024-04-19 DIAGNOSIS — D62 Acute posthemorrhagic anemia: Secondary | ICD-10-CM | POA: Diagnosis not present

## 2024-04-19 DIAGNOSIS — O34219 Maternal care for unspecified type scar from previous cesarean delivery: Secondary | ICD-10-CM | POA: Diagnosis present

## 2024-04-19 DIAGNOSIS — O99214 Obesity complicating childbirth: Principal | ICD-10-CM | POA: Diagnosis present

## 2024-04-19 DIAGNOSIS — O99824 Streptococcus B carrier state complicating childbirth: Secondary | ICD-10-CM | POA: Diagnosis not present

## 2024-04-19 DIAGNOSIS — O9081 Anemia of the puerperium: Secondary | ICD-10-CM | POA: Diagnosis not present

## 2024-04-19 DIAGNOSIS — Z3A40 40 weeks gestation of pregnancy: Secondary | ICD-10-CM | POA: Diagnosis not present

## 2024-04-19 DIAGNOSIS — O480 Post-term pregnancy: Secondary | ICD-10-CM | POA: Diagnosis not present

## 2024-04-19 DIAGNOSIS — Z8249 Family history of ischemic heart disease and other diseases of the circulatory system: Secondary | ICD-10-CM | POA: Diagnosis not present

## 2024-04-19 DIAGNOSIS — Z833 Family history of diabetes mellitus: Secondary | ICD-10-CM

## 2024-04-19 DIAGNOSIS — Z3490 Encounter for supervision of normal pregnancy, unspecified, unspecified trimester: Principal | ICD-10-CM | POA: Diagnosis present

## 2024-04-19 LAB — COMPREHENSIVE METABOLIC PANEL WITH GFR
ALT: 14 U/L (ref 0–44)
AST: 16 U/L (ref 15–41)
Albumin: 2.8 g/dL — ABNORMAL LOW (ref 3.5–5.0)
Alkaline Phosphatase: 130 U/L — ABNORMAL HIGH (ref 38–126)
Anion gap: 11 (ref 5–15)
BUN: 12 mg/dL (ref 6–20)
CO2: 19 mmol/L — ABNORMAL LOW (ref 22–32)
Calcium: 9.1 mg/dL (ref 8.9–10.3)
Chloride: 103 mmol/L (ref 98–111)
Creatinine, Ser: 0.69 mg/dL (ref 0.44–1.00)
GFR, Estimated: >60 mL/min (ref >60)
Glucose, Bld: 72 mg/dL (ref 70–99)
Potassium: 4 mmol/L (ref 3.5–5.1)
Sodium: 133 mmol/L — ABNORMAL LOW (ref 135–145)
Total Bilirubin: 0.4 mg/dL (ref 0.0–1.2)
Total Protein: 6.3 g/dL — ABNORMAL LOW (ref 6.5–8.1)

## 2024-04-19 LAB — CBC
HCT: 35.7 % — ABNORMAL LOW (ref 36.0–46.0)
Hemoglobin: 11.8 g/dL — ABNORMAL LOW (ref 12.0–15.0)
MCH: 29.4 pg (ref 26.0–34.0)
MCHC: 33.1 g/dL (ref 30.0–36.0)
MCV: 89 fL (ref 80.0–100.0)
Platelets: 253 K/uL (ref 150–400)
RBC: 4.01 MIL/uL (ref 3.87–5.11)
RDW: 15 % (ref 11.5–15.5)
WBC: 9 K/uL (ref 4.0–10.5)
nRBC: 0 % (ref 0.0–0.2)

## 2024-04-19 LAB — TYPE AND SCREEN
ABO/RH(D): O POS
Antibody Screen: NEGATIVE

## 2024-04-19 LAB — PROTEIN / CREATININE RATIO, URINE
Creatinine, Urine: 97 mg/dL
Protein Creatinine Ratio: 0.18 mg/mg{creat} — ABNORMAL HIGH (ref 0.00–0.15)
Total Protein, Urine: 17 mg/dL

## 2024-04-19 LAB — RPR: RPR Ser Ql: NONREACTIVE

## 2024-04-19 MED ORDER — PENICILLIN G POTASSIUM 5000000 UNITS IJ SOLR
5.0000 10*6.[IU] | Freq: Once | INTRAMUSCULAR | Status: AC
  Administered 2024-04-19: 5 10*6.[IU] via INTRAVENOUS
  Filled 2024-04-19: qty 5

## 2024-04-19 MED ORDER — LACTATED RINGERS IV SOLN: INTRAVENOUS | Status: DC

## 2024-04-19 MED ORDER — ONDANSETRON HCL 4 MG/2ML IJ SOLN
4.0000 mg | Freq: Four times a day (QID) | INTRAMUSCULAR | Status: DC | PRN
Start: 2024-04-19 — End: 2024-04-20

## 2024-04-19 MED ORDER — EPHEDRINE 5 MG/ML INJ: 10.0000 mg | INTRAVENOUS | Status: DC | PRN

## 2024-04-19 MED ORDER — OXYTOCIN-SODIUM CHLORIDE 30-0.9 UT/500ML-% IV SOLN
2.5000 [IU]/h | INTRAVENOUS | Status: DC
  Administered 2024-04-20: 2.5 [IU]/h via INTRAVENOUS

## 2024-04-19 MED ORDER — SOD CITRATE-CITRIC ACID 500-334 MG/5ML PO SOLN: 30.0000 mL | ORAL | Status: DC | PRN

## 2024-04-19 MED ORDER — LIDOCAINE HCL (PF) 1 % IJ SOLN
INTRAMUSCULAR | Status: DC | PRN
Start: 2024-04-19 — End: 2024-04-20
  Administered 2024-04-19: 5 mL via EPIDURAL

## 2024-04-19 MED ORDER — BUPIVACAINE HCL (PF) 0.25 % IJ SOLN
INTRAMUSCULAR | Status: DC | PRN
  Administered 2024-04-19: 7 mL via EPIDURAL

## 2024-04-19 MED ORDER — LIDOCAINE HCL (PF) 1 % IJ SOLN: 30.0000 mL | INTRAMUSCULAR | Status: DC | PRN

## 2024-04-19 MED ORDER — OXYCODONE-ACETAMINOPHEN 5-325 MG PO TABS: 1.0000 | ORAL_TABLET | ORAL | Status: DC | PRN

## 2024-04-19 MED ORDER — FENTANYL-BUPIVACAINE-NACL 0.5-0.125-0.9 MG/250ML-% EP SOLN
12.0000 mL/h | EPIDURAL | Status: DC | PRN
  Administered 2024-04-19: 12 mL/h via EPIDURAL
  Filled 2024-04-19 (×2): qty 250

## 2024-04-19 MED ORDER — LIDOCAINE HCL (PF) 1 % IJ SOLN
INTRAMUSCULAR | Status: DC | PRN
Start: 2024-04-19 — End: 2024-04-20
  Administered 2024-04-19: 3 mL via SUBCUTANEOUS

## 2024-04-19 MED ORDER — LACTATED RINGERS IV SOLN
500.0000 mL | INTRAVENOUS | Status: DC | PRN
  Administered 2024-04-19: 500 mL via INTRAVENOUS
  Administered 2024-04-19: 1000 mL via INTRAVENOUS

## 2024-04-19 MED ORDER — ACETAMINOPHEN 325 MG PO TABS
650.0000 mg | ORAL_TABLET | ORAL | Status: DC | PRN
  Administered 2024-04-20: 650 mg via ORAL
  Filled 2024-04-19: qty 2

## 2024-04-19 MED ORDER — OXYTOCIN BOLUS FROM INFUSION
333.0000 mL | Freq: Once | INTRAVENOUS | Status: AC
  Administered 2024-04-20: 333 mL via INTRAVENOUS

## 2024-04-19 MED ORDER — OXYCODONE-ACETAMINOPHEN 5-325 MG PO TABS: 2.0000 | ORAL_TABLET | ORAL | Status: DC | PRN

## 2024-04-19 MED ORDER — SODIUM CHLORIDE 0.9 % IV SOLN
INTRAVENOUS | Status: DC | PRN
  Administered 2024-04-19: 8 mL via EPIDURAL

## 2024-04-19 MED ORDER — PENICILLIN G POT IN DEXTROSE 60000 UNIT/ML IV SOLN
3.0000 10*6.[IU] | INTRAVENOUS | Status: DC
  Administered 2024-04-19 – 2024-04-20 (×6): 3 10*6.[IU] via INTRAVENOUS
  Filled 2024-04-19 (×5): qty 50

## 2024-04-19 MED ORDER — LACTATED RINGERS AMNIOINFUSION: INTRAVENOUS | Status: DC

## 2024-04-19 MED ORDER — TERBUTALINE SULFATE 1 MG/ML IJ SOLN: 0.2500 mg | Freq: Once | INTRAMUSCULAR | Status: DC | PRN

## 2024-04-19 MED ORDER — OXYTOCIN-SODIUM CHLORIDE 30-0.9 UT/500ML-% IV SOLN
1.0000 m[IU]/min | INTRAVENOUS | Status: DC
  Administered 2024-04-19: 2 m[IU]/min via INTRAVENOUS
  Administered 2024-04-19: 1 m[IU]/min via INTRAVENOUS
  Filled 2024-04-19 (×2): qty 500

## 2024-04-19 MED ORDER — PHENYLEPHRINE 80 MCG/ML (10ML) SYRINGE FOR IV PUSH (FOR BLOOD PRESSURE SUPPORT): 80.0000 ug | PREFILLED_SYRINGE | INTRAVENOUS | Status: DC | PRN

## 2024-04-19 MED ORDER — FENTANYL CITRATE (PF) 100 MCG/2ML IJ SOLN
50.0000 ug | INTRAMUSCULAR | Status: DC | PRN
  Filled 2024-04-19: qty 2

## 2024-04-19 MED ORDER — LACTATED RINGERS IV SOLN
500.0000 mL | Freq: Once | INTRAVENOUS | Status: AC
  Administered 2024-04-19: 500 mL via INTRAVENOUS

## 2024-04-19 MED ORDER — LIDOCAINE-EPINEPHRINE (PF) 2 %-1:200000 IJ SOLN
INTRAMUSCULAR | Status: DC | PRN
  Administered 2024-04-19: 3 mL via EPIDURAL

## 2024-04-19 MED ORDER — DIPHENHYDRAMINE HCL 50 MG/ML IJ SOLN: 12.5000 mg | INTRAMUSCULAR | Status: DC | PRN

## 2024-04-19 MED ORDER — FENTANYL CITRATE (PF) 100 MCG/2ML IJ SOLN
INTRAMUSCULAR | Status: DC | PRN
  Administered 2024-04-19: 100 ug via EPIDURAL

## 2024-04-19 NOTE — Anesthesia Procedure Notes (Signed)
 Epidural Patient location during procedure: OB  Staffing Anesthesiologist: Boone Fess, MD Performed: anesthesiologist   Preanesthetic Checklist Completed: patient identified, IV checked, site marked, risks and benefits discussed, surgical consent, monitors and equipment checked, pre-op evaluation and timeout performed  Epidural Patient position: sitting Prep: ChloraPrep Patient monitoring: heart rate, continuous pulse ox and blood pressure Approach: midline Location: L4-L5 Injection technique: LOR saline  Needle:  Needle type: Tuohy  Needle gauge: 17 G Needle length: 9 cm Needle insertion depth: 8 cm Catheter type: closed end flexible Catheter size: 19 Gauge Catheter at skin depth: 13 cm Test dose: negative and 1.5% lidocaine  with Epi 1:200 K  Assessment Sensory level: T10 Events: blood not aspirated, no cerebrospinal fluid, injection not painful, no injection resistance, no paresthesia and negative IV test  Additional Notes Second epidural (first one poorly functioning) Two attempts/skin entries.  Pt. Evaluated and documentation done after procedure finished. Patient identified. Risks/Benefits/Options discussed with patient including but not limited to bleeding, infection, nerve damage, paralysis, failed block, incomplete pain control, headache, blood pressure changes, nausea, vomiting, reactions to medication both or allergic, itching and postpartum back pain. Confirmed with bedside nurse the patient's most recent platelet count. Confirmed with patient that they are not currently taking any anticoagulation, have any bleeding history or any family history of bleeding disorders. Patient expressed understanding and wished to proceed. All questions were answered. Sterile technique was used throughout the entire procedure. Please see nursing notes for vital signs. Test dose was given through epidural catheter and negative prior to continuing to dose epidural or start infusion.  Warning signs of high block given to the patient including shortness of breath, tingling/numbness in hands, complete motor block, or any concerning symptoms with instructions to call for help. Patient was given instructions on fall risk and not to get out of bed. All questions and concerns addressed with instructions to call with any issues or inadequate analgesia.     Patient tolerated the insertion well without immediate complications.  Reason for block: procedure for painReason for block:procedure for pain

## 2024-04-19 NOTE — H&P (Addendum)
 Cheryl Curtis is a 33 y.o. female presenting for IOL d/t BMI >40 and postdates.  OB History     Gravida  4   Para  2   Term  2   Preterm      AB  1   Living  2      SAB      IAB  1   Ectopic      Multiple  0   Live Births  2          Past Medical History:  Diagnosis Date   Asthma    Rhinorrhea    stuffy, runny year round   Past Surgical History:  Procedure Laterality Date   CESAREAN SECTION N/A 07/12/2016   Procedure: CESAREAN SECTION;  Surgeon: Nena App, MD;  Location: [facility] BIRTHING SUITES;  Service: Obstetrics;  Laterality: N/A;   none     Family History: family history includes Diabetes in her maternal grandmother; Heart disease in her paternal grandfather; Hypertension in her maternal aunt, mother, and paternal grandmother; Sarcoidosis in her father. Social History:  reports that she has never smoked. She has never used smokeless tobacco. She reports that she does not currently use alcohol. She reports that she does not use drugs.     Maternal Diabetes: No Genetic Screening: Normal Maternal Ultrasounds/Referrals: Normal Fetal Ultrasounds or other Referrals:  None Maternal Substance Abuse:  No Significant Maternal Medications:  None Significant Maternal Lab Results:  Group B Strep positive Number of Prenatal Visits:greater than 3 verified prenatal visits Maternal Vaccinations:none documented Other Comments:  None  Review of Systems No F/C/N/V/D History   Blood pressure 132/74, pulse 87, temperature 98.2 F (36.8 C), temperature source Oral, resp. rate 18, height 5' 7 (1.702 m), weight 126.9 kg, SpO2 99%. Exam Physical Exam  Lungs CV Abdomen Extremities  FHT 140, no accels, no decels, mod variability Toco q7 min  Prenatal labs: ABO, Rh:   Antibody: Negative (01/15 0000) Rubella: Immune (01/15 0000) RPR: Nonreactive (01/15 0000)  HBsAg: Negative (01/15 0000)  HIV: Non-reactive (01/15 0000)  GBS:    Positive  Assessment/Plan: 32yo H5E7987 at 40 1/7wks being admitted for IOL d/t elevated BMI and postdates with h/o c-section and subsequent VBAC x 1.  PCN ordered for GBS prophylaxis.  Pitocin  ordered for IOL.  Pain medicine upon request.  Feta status overall reassuring with cat 1 tracing but non-reactive, LR bolus ordered.   Jon CINDERELLA Rummer 04/19/2024, 1:30 AM

## 2024-04-19 NOTE — Progress Notes (Signed)
 Subjective:    Epidural offered relief for the first 30 min after placement. Pt reports feeling pain in her pelvis and lower back despite standard interventions. RN notified Dr. Jefm.   Objective:    VS: BP (!) 113/56   Pulse 82   Temp 98 F (36.7 C) (Oral)   Resp 18   Ht 5' 7 (1.702 m)   Wt 126.9 kg   SpO2 99%   BMI 43.82 kg/m  FHR : baseline 135 / variability moderate / accelerations present / early decelerations Toco: contractions every 2-4 minutes  Membranes: AROM x4.5 hrs, remains clear Dilation: 5 Effacement (%): 70 Cervical Position: Posterior Station: -3 Presentation: Vertex Exam by:: ALONSO Peal Pitocin  12 mU/min  Assessment/Plan:   33 y.o. H5E7987 [redacted]w[redacted]d IOL for BMI of 44 Previous cesarean w/ successful VBAC Desires trial of labor  Labor: Progressing normally Fetal Wellbeing:  Category I Pain Control:  Epidural I/D:  GBS pos Anticipated MOD:  NSVD  Mercer KATHEE Peal DNP, CNM 04/19/2024 6:28 PM

## 2024-04-19 NOTE — Progress Notes (Signed)
 Subjective:    Coping well with contractions, perceives them as moderate cramps. Desires epidural anesthesia to manage discomforts of labor. Discussed amniotomy and pt agrees.   Objective:    VS: BP 116/66   Pulse 88   Temp 98 F (36.7 C) (Oral)   Resp 18   Ht 5' 7 (1.702 m)   Wt 126.9 kg   SpO2 99%   BMI 43.82 kg/m  FHR : baseline 135 / variability moderate / accelerations present / absent decelerations Toco: contractions every 2-4 minutes  Membranes: AROM, clear Dilation: 3 Effacement (%): 50 Cervical Position: Posterior Station: -3 Presentation: Vertex Exam by:: Cheryl Curtis, CNM Pitocin  12 mU/min  Assessment/Plan:   33 y.o. H5E7987 [redacted]w[redacted]d IOL for BMI of 44 Previous cesarean w/ successful VBAC Desires trial of labor  Labor: Progressing normally Fetal Wellbeing:  Category I Pain Control:  plans to have an epidural I/D:  GBS pos, PCN x3 doses Anticipated MOD:  NSVD  Cheryl KATHEE Peal DNP, CNM 04/19/2024 3:16 PM

## 2024-04-19 NOTE — Progress Notes (Signed)
 S: Now comfortable with second epidural placement.   O: Vitals:   04/19/24 2126 04/19/24 2129 04/19/24 2132 04/19/24 2137  BP: (!) 130/59 119/63 (!) 123/58 130/61  Pulse: 78 79 72 65  Resp:      Temp:      TempSrc:      SpO2:      Weight:      Height:       FHT:  FHR: 140 bpm, variability: moderate,  accelerations:  Present,  decelerations:  Absent UC:   irregular SVE:   Dilation: 5.5 Effacement (%): 90 Station: -2 Exam by:: Darryle Kendall, RN  A / P: Induction of labor due to BMI >40, progressing well on Pitocin   Fetal Wellbeing:  Category I GBS: Positive, PCN ongoing Pain Control:  Epidural Anticipated MOD:  VBAC  Will restart Pitocin  now. Anticipate VBAC.   Dr. Henry updated on patient status and plan of care.   Alan MARLA Molt, CNM, MSN 04/19/2024, 10:00 PM

## 2024-04-19 NOTE — Progress Notes (Signed)
 S: Mostly comfortable with epidural. Husband at the bedside.   O: Vitals:   04/19/24 1857 04/19/24 1902 04/19/24 1915 04/19/24 1923  BP: 131/64 126/63 126/78 136/83  Pulse: 79 65 73 91  Resp:    16  Temp:      TempSrc:      SpO2:      Weight:      Height:       FHT:  FHR: 125 bpm, variability: moderate,  accelerations:  Present,  decelerations:  Present variable UC:   regular, every 1.5-3.5 minutes SVE:   Dilation: 5.5 Effacement (%): 80 Station: -2 Exam by:: Mardene Lessig, CNM  A / P: Induction of labor due to BMI 44 and TOLAC, progressing well on Pitocin   Fetal Wellbeing:  Category I and Category II GBS: Positive, PCN ongoing Pain Control:  Epidural Anticipated MOD:  VBAC  Will start amnioinfusion now. Decrease Pitocin  to 6mu.   Dr. Henry updated on patient status and plan of care.   Alan MARLA Molt, CNM, MSN 04/19/2024, 7:58 PM

## 2024-04-19 NOTE — Anesthesia Procedure Notes (Signed)
 Epidural Patient location during procedure: OB Start time: 04/19/2024 3:39 PM End time: 04/19/2024 3:56 PM  Staffing Anesthesiologist: Jefm Garnette LABOR, MD Performed: anesthesiologist   Preanesthetic Checklist Completed: patient identified, IV checked, site marked, risks and benefits discussed, surgical consent, monitors and equipment checked, pre-op evaluation and timeout performed  Epidural Patient position: sitting Prep: DuraPrep and site prepped and draped Patient monitoring: continuous pulse ox and blood pressure Approach: midline Location: L3-L4 Injection technique: LOR air  Needle:  Needle type: Tuohy  Needle gauge: 17 G Needle length: 9 cm and 9 Needle insertion depth: 7 cm Catheter type: closed end flexible Catheter size: 19 Gauge Catheter at skin depth: 13 cm Test dose: negative  Assessment Events: blood not aspirated, no cerebrospinal fluid, injection not painful, no injection resistance, no paresthesia and negative IV test  Additional Notes Patient identified. Risks/Benefits/Options discussed with patient including but not limited to bleeding, infection, nerve damage, paralysis, failed block, incomplete pain control, headache, blood pressure changes, nausea, vomiting, reactions to medication both or allergic, itching and postpartum back pain. Confirmed with bedside nurse the patient's most recent platelet count. Confirmed with patient that they are not currently taking any anticoagulation, have any bleeding history or any family history of bleeding disorders. Patient expressed understanding and wished to proceed. All questions were answered. Sterile technique was used throughout the entire procedure. Please see nursing notes for vital signs. Test dose was given through epidural needle and negative prior to continuing to dose epidural or start infusion. Warning signs of high block given to the patient including shortness of breath, tingling/numbness in hands, complete  motor block, or any concerning symptoms with instructions to call for help. Patient was given instructions on fall risk and not to get out of bed. All questions and concerns addressed with instructions to call with any issues.  1 Attempt (S) . Patient tolerated procedure well.

## 2024-04-19 NOTE — Anesthesia Preprocedure Evaluation (Signed)
 Anesthesia Evaluation  Patient identified by MRN, date of birth, ID band Patient awake    Reviewed: Allergy & Precautions, NPO status , Patient's Chart, lab work & pertinent test results  Airway Mallampati: III  TM Distance: >3 FB Neck ROM: Full    Dental no notable dental hx.    Pulmonary asthma    Pulmonary exam normal breath sounds clear to auscultation       Cardiovascular negative cardio ROS Normal cardiovascular exam Rhythm:Regular Rate:Normal     Neuro/Psych negative neurological ROS  negative psych ROS   GI/Hepatic negative GI ROS, Neg liver ROS,,,  Endo/Other  negative endocrine ROS    Renal/GU negative Renal ROS     Musculoskeletal   Abdominal  (+) + obese  Peds  Hematology Lab Results      Component                Value               Date                      WBC                      9.0                 04/19/2024                HGB                      11.8 (L)            04/19/2024                HCT                      35.7 (L)            04/19/2024                MCV                      89.0                04/19/2024                PLT                      253                 04/19/2024              Anesthesia Other Findings   Reproductive/Obstetrics (+) Pregnancy                              Anesthesia Physical Anesthesia Plan  ASA: 3  Anesthesia Plan: Epidural   Post-op Pain Management:    Induction:   PONV Risk Score and Plan:   Airway Management Planned:   Additional Equipment:   Intra-op Plan:   Post-operative Plan:   Informed Consent: I have reviewed the patients History and Physical, chart, labs and discussed the procedure including the risks, benefits and alternatives for the proposed anesthesia with the patient or authorized representative who has indicated his/her understanding and acceptance.       Plan Discussed with:   Anesthesia Plan  Comments: (40.1 wk G4P2 forTolac w LEA)  Anesthesia Quick Evaluation

## 2024-04-20 ENCOUNTER — Encounter (HOSPITAL_COMMUNITY): Payer: Self-pay | Admitting: Obstetrics and Gynecology

## 2024-04-20 DIAGNOSIS — O34219 Maternal care for unspecified type scar from previous cesarean delivery: Secondary | ICD-10-CM | POA: Diagnosis not present

## 2024-04-20 MED ORDER — DIPHENHYDRAMINE HCL 25 MG PO CAPS: 25.0000 mg | ORAL_CAPSULE | Freq: Four times a day (QID) | ORAL | Status: DC | PRN

## 2024-04-20 MED ORDER — PRENATAL MULTIVITAMIN CH
1.0000 | ORAL_TABLET | Freq: Every day | ORAL | Status: DC
  Administered 2024-04-20 – 2024-04-21 (×2): 1 via ORAL
  Filled 2024-04-20 (×2): qty 1

## 2024-04-20 MED ORDER — ZOLPIDEM TARTRATE 5 MG PO TABS: 5.0000 mg | ORAL_TABLET | Freq: Every evening | ORAL | Status: DC | PRN

## 2024-04-20 MED ORDER — SIMETHICONE 80 MG PO CHEW: 80.0000 mg | CHEWABLE_TABLET | ORAL | Status: DC | PRN

## 2024-04-20 MED ORDER — ONDANSETRON HCL 4 MG PO TABS: 4.0000 mg | ORAL_TABLET | ORAL | Status: DC | PRN

## 2024-04-20 MED ORDER — DIBUCAINE (PERIANAL) 1 % EX OINT: 1.0000 | TOPICAL_OINTMENT | CUTANEOUS | Status: DC | PRN

## 2024-04-20 MED ORDER — SENNOSIDES-DOCUSATE SODIUM 8.6-50 MG PO TABS
2.0000 | ORAL_TABLET | Freq: Every day | ORAL | Status: DC
  Administered 2024-04-21: 2 via ORAL
  Filled 2024-04-20: qty 2

## 2024-04-20 MED ORDER — BENZOCAINE-MENTHOL 20-0.5 % EX AERO
1.0000 | INHALATION_SPRAY | CUTANEOUS | Status: DC | PRN
Start: 2024-04-20 — End: 2024-04-21

## 2024-04-20 MED ORDER — ONDANSETRON HCL 4 MG/2ML IJ SOLN: 4.0000 mg | INTRAMUSCULAR | Status: DC | PRN

## 2024-04-20 MED ORDER — IBUPROFEN 600 MG PO TABS
600.0000 mg | ORAL_TABLET | Freq: Four times a day (QID) | ORAL | Status: DC
  Administered 2024-04-20 – 2024-04-21 (×5): 600 mg via ORAL
  Filled 2024-04-20 (×5): qty 1

## 2024-04-20 MED ORDER — COCONUT OIL OIL
1.0000 | TOPICAL_OIL | Status: DC | PRN
Start: 2024-04-20 — End: 2024-04-21

## 2024-04-20 MED ORDER — WITCH HAZEL-GLYCERIN EX PADS: 1.0000 | MEDICATED_PAD | CUTANEOUS | Status: DC | PRN

## 2024-04-20 MED ORDER — ACETAMINOPHEN 325 MG PO TABS: 650.0000 mg | ORAL_TABLET | ORAL | Status: DC | PRN

## 2024-04-20 MED ORDER — TETANUS-DIPHTH-ACELL PERTUSSIS 5-2.5-18.5 LF-MCG/0.5 IM SUSY: 0.5000 mL | PREFILLED_SYRINGE | Freq: Once | INTRAMUSCULAR | Status: DC

## 2024-04-20 NOTE — Anesthesia Postprocedure Evaluation (Signed)
 Anesthesia Post Note  Patient: AYANE DELANCEY  Procedure(s) Performed: AN AD HOC LABOR EPIDURAL     Patient location during evaluation: Mother Baby Anesthesia Type: Epidural Level of consciousness: awake and alert Pain management: pain level controlled Vital Signs Assessment: post-procedure vital signs reviewed and stable Respiratory status: spontaneous breathing, nonlabored ventilation and respiratory function stable Cardiovascular status: stable Postop Assessment: no headache, no backache and epidural receding Anesthetic complications: no   No notable events documented.  Last Vitals:  Vitals:   04/20/24 1000 04/20/24 1401  BP: 113/68 121/79  Pulse: 84 81  Resp:    Temp: 36.8 C 37 C  SpO2:  100%    Last Pain:  Vitals:   04/20/24 1401  TempSrc: Axillary  PainSc:    Pain Goal:                   Caryl Manas

## 2024-04-20 NOTE — Anesthesia Postprocedure Evaluation (Signed)
 Anesthesia Post Note  Patient: Cheryl Curtis  Procedure(s) Performed: AN AD HOC LABOR EPIDURAL     Patient location during evaluation: Mother Baby Anesthesia Type: Epidural Level of consciousness: awake, awake and alert and oriented Pain management: satisfactory to patient Vital Signs Assessment: post-procedure vital signs reviewed and stable Respiratory status: spontaneous breathing, nonlabored ventilation and respiratory function stable Cardiovascular status: blood pressure returned to baseline and stable Postop Assessment: no headache, no backache, no apparent nausea or vomiting, able to ambulate, adequate PO intake and patient able to bend at knees Anesthetic complications: no   No notable events documented.  Last Vitals:  Vitals:   04/20/24 1000 04/20/24 1401  BP: 113/68 121/79  Pulse: 84 81  Resp:    Temp: 36.8 C 37 C  SpO2:  100%    Last Pain:  Vitals:   04/20/24 1401  TempSrc: Axillary  PainSc:    Pain Goal:                   [name]

## 2024-04-20 NOTE — Lactation Note (Signed)
 This note was copied from a baby's chart. Lactation Consultation Note  Patient Name: Cheryl Curtis Unijb'd Date: 04/20/2024 Age:34 hours Reason for consult: Initial assessment;Term;Breastfeeding assistance  P3- MOB reports that infant has only had two feedings in the 14 hrs infant has been born. MOB reports that infant is full of fluid and has had to be suctioned to eat. MOB reports that the first feeding went well, but the second feeding wasn't until 1830 after he had been suctioned by the RN. It had been a few hours since infant last ate, so LC encouraged attempting a latch at this time. MOB was in agreement. We first hand expressed to visualize colostrum. With one expression, colostrum was noted. LC then used a gloved finger to offer EBM to infant and he immediately started gagging. No emesis occurred at this time. We then placed infant on the left breast in the cross cradle hold. Infant latched well with flanged lips, but would not suck. After a few attempts to get infant to suck, he started gagging on the breast. At this time, MOB wanted to stop for STS before attempting to offer formula.  LC reviewed the first 24 hr birthday nap, day 2 cluster feeding, feeding infant on cue 8-12x in 24 hrs, not allowing infant to go over 3 hrs without a feeding, CDC milk storage guidelines, LC services handout and engorgement/breast care. LC encouraged MOB to call for further assistance as needed.  Maternal Data Has patient been taught Hand Expression?: Yes Does the patient have breastfeeding experience prior to this delivery?: Yes How long did the patient breastfeed?: 2 months with both older children  Feeding Mother's Current Feeding Choice: Breast Milk and Formula  LATCH Score Latch: Too sleepy or reluctant, no latch achieved, no sucking elicited.  Audible Swallowing: None  Type of Nipple: Everted at rest and after stimulation  Comfort (Breast/Nipple): Soft / non-tender  Hold (Positioning):  Full assist, staff holds infant at breast  LATCH Score: 4   Lactation Tools Discussed/Used Pump Education: Milk Storage  Interventions Interventions: Breast feeding basics reviewed;Assisted with latch;Hand express;Breast compression;Adjust position;Support pillows;Position options;Education;LC Services brochure  Discharge Discharge Education: Engorgement and breast care;Warning signs for feeding baby Pump: Hands Free;Personal  Consult Status Consult Status: Follow-up Date: 04/21/24 Follow-up type: In-patient    Recardo Hoit BS, IBCLC 04/20/2024, 9:25 PM

## 2024-04-21 LAB — CBC
HCT: 27.3 % — ABNORMAL LOW (ref 36.0–46.0)
Hemoglobin: 9 g/dL — ABNORMAL LOW (ref 12.0–15.0)
MCH: 29.9 pg (ref 26.0–34.0)
MCHC: 33 g/dL (ref 30.0–36.0)
MCV: 90.7 fL (ref 80.0–100.0)
Platelets: 183 K/uL (ref 150–400)
RBC: 3.01 MIL/uL — ABNORMAL LOW (ref 3.87–5.11)
RDW: 15.4 % (ref 11.5–15.5)
WBC: 10.2 K/uL (ref 4.0–10.5)
nRBC: 0 % (ref 0.0–0.2)

## 2024-04-21 MED ORDER — POLYSACCHARIDE IRON COMPLEX 150 MG PO CAPS: 150.0000 mg | ORAL_CAPSULE | Freq: Every day | ORAL | 3 refills | Status: AC

## 2024-04-21 MED ORDER — IBUPROFEN 600 MG PO TABS: 600.0000 mg | ORAL_TABLET | Freq: Four times a day (QID) | ORAL | 0 refills | Status: DC

## 2024-04-21 MED ORDER — POLYSACCHARIDE IRON COMPLEX 150 MG PO CAPS
150.0000 mg | ORAL_CAPSULE | Freq: Every day | ORAL | Status: DC
  Administered 2024-04-21: 150 mg via ORAL
  Filled 2024-04-21: qty 1

## 2024-04-21 NOTE — Lactation Note (Signed)
 This note was copied from a baby's chart. Lactation Consultation Note  Patient Name: Cheryl Curtis Date: 04/21/2024 Age:34 hours Reason for consult: Maternal discharge (infant weight loss -1.06%).  MOB is breast and formula feeding infant.   Per MOB, she has latched infant twice at the breast today, each feeding was 10 minutes in length. Infant has mostly been formula feeding. Per MOB, infant latches well at the breast no latch concerns. LC discussed MOB latch infant first every feeding to help stimulate and establish her milk supply then afterwards offer formula. LC discussed hunger cues and signs of satiety. MOB will continue to feed infant by cues,8-12 times within 24 hours, when breastfeeding skin to skin. Per MOB, she does have DEBP at home.   Current feeding plan Day 2 1- MOB will latch infant first every feeding, by cues, on demand, 8-12 times within 24 hours, skin to skin. MOB knows if latching infant on the first breast to offer the 2nd breast during the same feeding.  2- After latching infant at the breast, MOB choice will supplement infant with formula. MOB has handout  Feeding Guidelines- MOB knows that if infant is latched she can offer (7-12 mls) or more per feeding and if infant does not latch on Day 2( 15-30 mls ) per feeding.  3- MOB has list of  community resources : [facility] hotline, LC breastfeeding support and [facility] outpatient clinic.  LC discussed breastfeeding discharge:   Engorgement prevention and treatment, signs of dehydration in infant and how to know if breastfeeding is going well, infant's  input and output within 1st week of life.    Maternal Data    Feeding Mother's Current Feeding Choice: Breast Milk and Formula Nipple Type: Slow - flow  LATCH Score  LC did not observe latch with recent feeding.                   Lactation Tools Discussed/Used    Interventions Interventions: Skin to skin;DEBP;Education;Guidelines for Milk Supply and  Pumping Schedule Handout;LC Services brochure;CDC Guidelines for Breast Pump Cleaning;CDC milk storage guidelines  Discharge Discharge Education: Engorgement and breast care;Warning signs for feeding baby  Consult Status Consult Status: Complete Date: 04/21/24 Follow-up type: Physician    Cheryl Curtis 04/21/2024, 5:34 PM

## 2024-04-21 NOTE — Discharge Summary (Signed)
 VBAC OB Discharge Summary       Patient Name: Cheryl Curtis DOB: 08-01-1991 MRN: [id number]  Date of admission: 04/19/2024 Delivering MD: JOSHUA PALMA K Date of delivery: 04/20/2024 Type of delivery: VBAC  Newborn Data: Sex: Baby female Circumcision:  in pt circ prior to discharge today Live born female  Birth Weight: 8 lb 5 oz (3770 g) APGAR: 8, 9  Newborn Delivery   Birth date/time: 04/20/2024 06:36:00 Delivery type: VBAC, Spontaneous     Feeding: breast and bottle Infant being discharge to home with mother in stable condition.   Admitting diagnosis: Encounter for induction of labor [Z34.90] Intrauterine pregnancy: [redacted]w[redacted]d     Secondary diagnosis:  Principal Problem:   Postpartum care following vaginal delivery 7/23 Active Problems:   ABLA (acute blood loss anemia)   Encounter for induction of labor   Morbid obesity (HCC)   VBAC (vaginal birth after Cesarean)   Second degree perineal laceration                                Complications: None                                                              Intrapartum Procedures: spontaneous vaginal delivery and GBS prophylaxis Postpartum Procedures: none Complications-Operative and Postpartum: 2nd degree perineal laceration Augmentation: AROM and Pitocin    History of Present Illness: Ms. Cheryl Curtis is a 33 y.o. female, [phone], who presents at [redacted]w[redacted]d weeks gestation. The patient has been followed at  [facility]  Her pregnancy has been complicated by:  Patient Active Problem List   Diagnosis Date Noted   Morbid obesity (HCC) 04/20/2024   VBAC (vaginal birth after Cesarean) 04/20/2024   Second degree perineal laceration 04/20/2024   Postpartum care following vaginal delivery 7/23 04/20/2024   Encounter for induction of labor 04/19/2024   ABLA (acute blood loss anemia) 06/09/2020     Active Ambulatory Problems    Diagnosis Date Noted   ABLA (acute blood loss anemia)  06/09/2020   Resolved Ambulatory Problems    Diagnosis Date Noted   Allergic rhinitis 01/01/2016   Breech presentation 07/09/2016   Fetal malpresentation 07/12/2016   Cesarean delivery delivered 07/12/2016   Asthma 12/23/2016   Vitamin D  deficiency 12/23/2016   Post-dates pregnancy 06/06/2020   COVID-19 virus infection 06/06/2020   VBAC (vaginal birth after Cesarean) 9/9 06/07/2020   Second degree perineal laceration 06/07/2020   Postpartum care following vaginal delivery 9/9 06/07/2020   Past Medical History:  Diagnosis Date   Asthma    Rhinorrhea      Hospital course:  Induction of Labor With Vaginal Delivery   33 y.o. yo [phone] at [redacted]w[redacted]d was admitted to the hospital 04/19/2024 for induction of labor.  Indication for induction: Postdates and Elevated BMI.  Patient had an labor course complicated by pt was admitted on 04/19/2024 for iol at 40.6 for postdates and elevated bmi, progressed with pitocin  and arom to vbac on 7/23 over 2nd degree laceration, ebl was with hgb drop of 11.8-9.0 asymptomatic and on po iron . Pt was gbs+ and tx with 7 doses pcn prior to delivery. Pt had one elevated BP during labor but  no dx and pcr was 0.18, BP normotensive since 124/74, asymptomatic.  Membrane Rupture Time/Date: 1:38 PM,04/19/2024  Delivery Method:VBAC, Spontaneous Operative Delivery:N/A Episiotomy: None Lacerations:  2nd degree Details of delivery can be found in separate delivery note.  Patient had a postpartum course complicated by None. Pt meets criteria for early discharge and desires to go home. Patient is discharged home 04/21/24.  Newborn Data: Birth date:04/20/2024 Birth time:6:36 AM Gender:Female Living status:Living Apgars:8 ,9  Weight:3770 g Postpartum Day # 1 :  Patient up ad lib, denies syncope or dizziness. Reports consuming regular diet without issues and denies N/V. Patient reports 0 bowel movement + passing flatus.  Denies issues with urination and reports bleeding is  lighter.  Patient is BR/BT feeding and reports going well.  Desires undecided for postpartum contraception.  Pain is being appropriately managed with use of po meds.   Physical exam  Vitals:   04/20/24 1401 04/20/24 2029 04/21/24 0032 04/21/24 0515  BP: 121/79 122/75 125/73 124/74  Pulse: 81 95 72 80  Resp:  18 18 18   Temp: 98.6 F (37 C) 98.6 F (37 C) 97.7 F (36.5 C) 98.1 F (36.7 C)  TempSrc: Axillary Axillary Oral Oral  SpO2: 100% 100% 99% 99%  Weight:      Height:       General: alert, cooperative, and no distress Lochia: appropriate Uterine Fundus: firm Perineum: approximate DVT Evaluation: No evidence of DVT seen on physical exam. Negative Homan's sign. No cords or calf tenderness. No significant calf/ankle edema.  Labs: Lab Results  Component Value Date   WBC 10.2 04/21/2024   HGB 9.0 (L) 04/21/2024   HCT 27.3 (L) 04/21/2024   MCV 90.7 04/21/2024   PLT 183 04/21/2024      Latest Ref Rng & Units 04/19/2024    2:08 AM  CMP  Glucose 70 - 99 mg/dL 72   BUN 6 - 20 mg/dL 12   Creatinine 9.55 - 1.00 mg/dL 9.30   Sodium 864 - 854 mmol/L 133   Potassium 3.5 - 5.1 mmol/L 4.0   Chloride 98 - 111 mmol/L 103   CO2 22 - 32 mmol/L 19   Calcium 8.9 - 10.3 mg/dL 9.1   Total Protein 6.5 - 8.1 g/dL 6.3   Total Bilirubin 0.0 - 1.2 mg/dL 0.4   Alkaline Phos 38 - 126 U/L 130   AST 15 - 41 U/L 16   ALT 0 - 44 U/L 14     Date of discharge: 04/21/2024 Discharge Diagnoses: Term Pregnancy-delivered Discharge instruction: per After Visit Summary and Baby and Me Booklet.  After visit meds:   Activity:           unrestricted and pelvic rest Advance as tolerated. Pelvic rest for 6 weeks.  Diet:                routine Medications: PNV, Ibuprofen , Colace, and Iron  Postpartum contraception: Undecided Condition:  Pt discharge to home with baby in stable condition  Meds: Allergies as of 04/21/2024   No Known Allergies      Medication List     TAKE these medications     cholecalciferol  25 MCG (1000 UNIT) tablet Commonly known as: VITAMIN D3 Take 1,000 Units by mouth daily.   ibuprofen  600 MG tablet Commonly known as: ADVIL  Take 1 tablet (600 mg total) by mouth every 6 (six) hours.   iron  polysaccharides 150 MG capsule Commonly known as: NIFEREX Take 1 capsule (150 mg total) by mouth daily. Start taking  on: April 22, 2024   triamcinolone  55 MCG/ACT Aero nasal inhaler Commonly known as: NASACORT  Place 1 spray into the nose daily. Start with 1 spray each side twice a day for 3 days, then reduce to daily.   Vitamin D  (Ergocalciferol ) 1.25 MG (50000 UNIT) Caps capsule Commonly known as: DRISDOL  Take 1 capsule (50,000 Units total) by mouth every 7 (seven) days.        Discharge Follow Up:   Follow-up Information     [facility]. Schedule an appointment as soon as possible for a visit in 6 week(s).   Specialty: Obstetrics and Gynecology Why: 6 weeks PPV Contact information: [address] [place]  [address] [phone]                 Jadrian Bulman  CNM, FNP-C, PMHNP-BC  [id number]  [place], [place] [address]  Cell: [phone]  Office Phone: [phone] Fax: [phone] 04/21/2024  10:14 AM

## 2024-05-10 ENCOUNTER — Telehealth (HOSPITAL_COMMUNITY): Payer: Self-pay | Admitting: *Deleted

## 2024-05-10 NOTE — Telephone Encounter (Signed)
 05/10/2024  Name: Adaline Trejos MRN: [id number] DOB: [birth year]  Reason for Call:  Transition of Care Hospital Discharge Call  Contact Status: Patient Contact Status: Message  Language assistant needed:          Follow-Up Questions:    Van Postnatal Depression Scale:  In the Past 7 Days:    PHQ2-9 Depression Scale:     Discharge Follow-up:    Post-discharge interventions: NA  Mliss Sieve, RN 05/10/2024 10:08

## 2024-05-12 ENCOUNTER — Ambulatory Visit (INDEPENDENT_AMBULATORY_CARE_PROVIDER_SITE_OTHER): Payer: BC Managed Care – PPO | Admitting: Family Medicine

## 2024-05-12 ENCOUNTER — Encounter: Payer: Self-pay | Admitting: Family Medicine

## 2024-05-12 VITALS — BP 98/72 | HR 87 | Temp 97.4°F | Ht 67.0 in | Wt 247.2 lb

## 2024-05-12 DIAGNOSIS — E559 Vitamin D deficiency, unspecified: Secondary | ICD-10-CM | POA: Diagnosis not present

## 2024-05-12 DIAGNOSIS — Z0000 Encounter for general adult medical examination without abnormal findings: Secondary | ICD-10-CM

## 2024-05-12 NOTE — Patient Instructions (Addendum)
 Continue 1000 units of vitamin D . Continue with iron    No labs since recently done- only thing we are missing is cholesterol and that's fine to wait one year.   Congratulations again! Glad you have done so well.   Recommended follow up: Return in about 1 year (around 05/12/2025) for physical or sooner if needed.Schedule b4 you leave.

## 2024-05-12 NOTE — Progress Notes (Signed)
 Phone [phone]   Subjective:  Patient presents today for their annual physical. Chief complaint-noted.   See problem oriented charting- ROS- full  review of systems was completed and negative Per full ROS sheet completed by patient except for topics noted under acute/chronic concerns  The following were reviewed and entered/updated in epic: Past Medical History:  Diagnosis Date   Asthma    Rhinorrhea    stuffy, runny year round   Patient Active Problem List   Diagnosis Date Noted   Morbid obesity (HCC) 04/20/2024   VBAC (vaginal birth after Cesarean) 04/20/2024   Second degree perineal laceration 04/20/2024   Postpartum care following vaginal delivery 7/23 04/20/2024   Encounter for induction of labor 04/19/2024   ABLA (acute blood loss anemia) 06/09/2020   Past Surgical History:  Procedure Laterality Date   CESAREAN SECTION N/A 07/12/2016   Procedure: CESAREAN SECTION;  Surgeon: Nena App, MD;  Location: [facility] BIRTHING SUIT[facility];  Service: Obstetrics;  Laterality: N/A;   none      Family History  Problem Relation Age of Onset   Hypertension Mother    Sarcoidosis Father    Heart disease Paternal Grandfather        unknown specific ailment   Hypertension Paternal Grandmother    Diabetes Maternal Grandmother    Hypertension Maternal Aunt     Medications- reviewed and updated Current Outpatient Medications  Medication Sig Dispense Refill   cholecalciferol  (VITAMIN D3) 25 MCG (1000 UNIT) tablet Take 1,000 Units by mouth daily.     iron  polysaccharides (NIFEREX) 150 MG capsule Take 1 capsule (150 mg total) by mouth daily. 30 capsule 3   Vitamin D , Ergocalciferol , (DRISDOL ) 1.25 MG (50000 UNIT) CAPS capsule Take 1 capsule (50,000 Units total) by mouth every 7 (seven) days. (Patient not taking: Reported on 05/12/2024) 13 capsule 1   No current facility-administered medications for this visit.    Allergies-reviewed and updated No Known Allergies  Social History    Social History Narrative   Family: Married. Toribio Raddle. 18 months in 12/2017. 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          Hobbies: four wheelers, enjoys travel   Objective  Objective:  BP 98/72 (BP Location: Left Arm, Patient Position: Sitting, Cuff Size: Normal)   Pulse 87   Temp (!) 97.4 F (36.3 C) (Temporal)   Ht 5' 7 (1.702 m)   Wt 247 lb 3.2 oz (112.1 kg)   SpO2 97%   BMI 38.72 kg/m  Gen: NAD, resting comfortably HEENT: Mucous membranes are moist. Oropharynx normal Neck: no thyromegaly CV: RRR no murmurs rubs or gallops Lungs: CTAB no crackles, wheeze, rhonchi Abdomen: soft/nontender/nondistended/normal bowel sounds. No rebound or guarding.  Ext: no edema Skin: warm, dry Neuro: grossly normal, moves all extremities, PERRLA   Assessment and Plan   33 y.o. female presenting for annual physical.  Health Maintenance counseling: 1. Anticipatory guidance: Patient counseled regarding regular dental exams -q6 months, eye exams -yearly for contacts,  avoiding smoking and second hand smoke , limiting alcohol to 1 beverage per day- not at present - and in past was low usage , no illicit drugs .   2. Risk factor reduction:  Advised patient of need for regular exercise and diet rich and fruits and vegetables to  reduce risk of heart attack and stroke.  Exercise- trying to do some walking at [employer] coming up and on lunch when goes back to work.  Diet/weight management-weight up 10 pounds in the last year but postpartum. Wants to work on weight loss but has done well postpartum- congratulated her Wt Readings from Last 3 Encounters:  05/12/24 247 lb 3.2 oz (112.1 kg)  04/19/24 279 lb 12.2 oz (126.9 kg)  10/05/23 246 lb 6 oz (111.8 kg)  3. Immunizations/screenings/ancillary  studies-recommended fall flu and COVID shot  Immunization History  Administered Date(s) Administered   HPV Quadrivalent 10/02/2014, 12/01/2014, 04/24/2015   Influenza,inj,Quad PF,6+ Mos 07/13/2017, 09/19/2018   PFIZER(Purple Top)SARS-COV-2 Vaccination 07/04/2020, 07/25/2020   Tdap 04/02/2016, 04/22/2016, 03/16/2020  4. Cervical cancer screening- follows with GYN-march 3 normal and HPV negative 5. Breast cancer screening-  breast exam with GYN and mammogram -possible baseline at 35 and regularly at age 84 yearly  33. Colon cancer screening -  no family history, start at age 49. 7. Skin cancer screening-low risk due to melanin content.  advised regular sunscreen use. Denies worrisome, changing, or new skin lesions.  8. Birth control/STD check-only active with husband- he's considering vasectomy but is anxious 9. Osteoporosis screening at 65-we will plan on this at much later date 10. Smoking associated screening -never smoker  Status of chronic or acute concerns   # Social update-new baby born in [place] doing well! SABRA VBAC.  Baby about 35 weeks old. Trying to take some time off but has been challenging.   #anemia after childbirth- on iron  supplementation- wants to hold off on CBC check  #screening hyperlipidemia S: Medication:none  Lab Results  Component Value Date   CHOL 172 05/12/2023   HDL 47.60 05/12/2023   LDLCALC 108 (H) 05/12/2023   TRIG 83.0 05/12/2023   CHOLHDL 4 05/12/2023   A/P: lipids mildly elevated last year but we opted to hold off on labs and recheck next year  #Vitamin D  deficiency S: Medication: doing 1000 units a day- didn't take full course of high dose as became pregnant last year. On other hand had vitamin D  checked and reports normal - we found in her labs and noted at 38 Last vitamin D . Lab Results  Component Value Date   VD25OH 20.38 (L) 05/12/2023  A/P: since vitamin D  was in normal range recommended remaining on 1000 units long term.   #labs- just had  CBC, CMP with understandable mildly off values such as anemia above- wants to hold off on repeat- check next year -a1c of 5.5 - encouraging   #asthma- doing well without medication(s)   #possible eczema- keeps triamcinolone  on hand- no recent issues  Recommended follow up: Return in about 1 year (around 05/12/2025) for physical or sooner if needed.Schedule b4 you leave.  Lab/Order associations: no labs    ICD-10-CM   1. Preventative health care  Z00.00     2. Vitamin D  deficiency  E55.9       No orders of the defined types were placed in this encounter.   Return precautions advised.  Garnette Lukes, MD

## 2024-05-23 DIAGNOSIS — Z1331 Encounter for screening for depression: Secondary | ICD-10-CM | POA: Diagnosis not present

## 2025-05-15 ENCOUNTER — Encounter: Admitting: Family Medicine
